# Patient Record
Sex: Female | Born: 1995 | Race: White | Hispanic: No | Marital: Married | Smoking: Never smoker
Health system: Southern US, Community
[De-identification: ages and names within clinical notes are randomized; demographics above are authoritative.]

## PROBLEM LIST (undated history)

## (undated) DIAGNOSIS — F32A Depression, unspecified: Secondary | ICD-10-CM

## (undated) DIAGNOSIS — F909 Attention-deficit hyperactivity disorder, unspecified type: Secondary | ICD-10-CM

## (undated) DIAGNOSIS — R519 Headache, unspecified: Secondary | ICD-10-CM

## (undated) DIAGNOSIS — Z87820 Personal history of traumatic brain injury: Secondary | ICD-10-CM

## (undated) DIAGNOSIS — D649 Anemia, unspecified: Secondary | ICD-10-CM

## (undated) DIAGNOSIS — J45909 Unspecified asthma, uncomplicated: Secondary | ICD-10-CM

## (undated) DIAGNOSIS — U071 COVID-19: Secondary | ICD-10-CM

## (undated) DIAGNOSIS — L2989 Other pruritus: Secondary | ICD-10-CM

## (undated) DIAGNOSIS — Z973 Presence of spectacles and contact lenses: Secondary | ICD-10-CM

## (undated) DIAGNOSIS — F329 Major depressive disorder, single episode, unspecified: Secondary | ICD-10-CM

---
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
## 2015-06-15 DIAGNOSIS — N921 Excessive and frequent menstruation with irregular cycle: Secondary | ICD-10-CM | POA: Insufficient documentation

## 2016-07-26 DIAGNOSIS — N930 Postcoital and contact bleeding: Secondary | ICD-10-CM | POA: Insufficient documentation

## 2018-02-08 ENCOUNTER — Ambulatory Visit: Payer: BLUE CROSS/BLUE SHIELD | Admitting: Neurology

## 2018-07-29 DIAGNOSIS — F4323 Adjustment disorder with mixed anxiety and depressed mood: Secondary | ICD-10-CM | POA: Insufficient documentation

## 2018-09-03 DIAGNOSIS — J4520 Mild intermittent asthma, uncomplicated: Secondary | ICD-10-CM | POA: Insufficient documentation

## 2018-09-03 DIAGNOSIS — T783XXA Angioneurotic edema, initial encounter: Secondary | ICD-10-CM | POA: Insufficient documentation

## 2019-05-03 ENCOUNTER — Other Ambulatory Visit: Payer: Self-pay

## 2019-05-03 ENCOUNTER — Emergency Department (HOSPITAL_COMMUNITY)
Admission: EM | Admit: 2019-05-03 | Discharge: 2019-05-03 | Disposition: A | Discharge status: Home / Self Care | Payer: BLUE CROSS/BLUE SHIELD | Attending: Emergency Medicine | Admitting: Emergency Medicine

## 2019-05-03 ENCOUNTER — Emergency Department
Admission: EM | Admit: 2019-05-03 | Discharge: 2019-05-03 | Discharge status: Against Medical Advice | Payer: Self-pay | Attending: Emergency Medicine | Admitting: Emergency Medicine

## 2019-05-03 ENCOUNTER — Encounter (HOSPITAL_COMMUNITY): Payer: Self-pay | Admitting: Emergency Medicine

## 2019-05-03 ENCOUNTER — Emergency Department (HOSPITAL_COMMUNITY): Payer: BLUE CROSS/BLUE SHIELD

## 2019-05-03 DIAGNOSIS — Y939 Activity, unspecified: Secondary | ICD-10-CM | POA: Insufficient documentation

## 2019-05-03 DIAGNOSIS — Y999 Unspecified external cause status: Secondary | ICD-10-CM | POA: Diagnosis not present

## 2019-05-03 DIAGNOSIS — G44319 Acute post-traumatic headache, not intractable: Secondary | ICD-10-CM | POA: Diagnosis not present

## 2019-05-03 DIAGNOSIS — S199XXA Unspecified injury of neck, initial encounter: Secondary | ICD-10-CM | POA: Diagnosis present

## 2019-05-03 DIAGNOSIS — S161XXA Strain of muscle, fascia and tendon at neck level, initial encounter: Secondary | ICD-10-CM | POA: Diagnosis not present

## 2019-05-03 DIAGNOSIS — Y92410 Unspecified street and highway as the place of occurrence of the external cause: Secondary | ICD-10-CM | POA: Diagnosis not present

## 2019-05-03 HISTORY — DX: Depression, unspecified: F32.A

## 2019-05-03 MED ORDER — METHOCARBAMOL 500 MG PO TABS
500.0000 mg | ORAL_TABLET | Freq: Two times a day (BID) | ORAL | 0 refills | Status: DC
Start: 2019-05-03 — End: 2019-08-05
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
## 2019-05-03 NOTE — ED Notes (Signed)
Patient verbalizes understanding of discharge instructions. Opportunity for questioning and answers were provided. Armband removed by staff, pt discharged from ED ambulatory.   
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
## 2019-05-05 ENCOUNTER — Telehealth: Payer: Self-pay
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
## 2019-05-05 NOTE — Telephone Encounter (Signed)
Returned pt's call.  She is having some memory difficulty, blurred vision.  She is having headaches and some sensitivity to light, particularly blue light.  She also notes some increased irritability.  She has been taking Flexeril and Excedrin.  Scheduled pt to see Dr. Denyse Amass on 05/08/19.

## 2019-05-08 ENCOUNTER — Encounter: Payer: Self-pay | Admitting: Family Medicine

## 2019-05-08 ENCOUNTER — Other Ambulatory Visit: Payer: Self-pay

## 2019-05-08 ENCOUNTER — Ambulatory Visit (INDEPENDENT_AMBULATORY_CARE_PROVIDER_SITE_OTHER): Payer: BLUE CROSS/BLUE SHIELD | Admitting: Family Medicine

## 2019-05-08 VITALS — BP 110/78 | HR 85 | Ht 61.0 in | Wt 157.0 lb

## 2019-05-08 DIAGNOSIS — R41840 Attention and concentration deficit: Secondary | ICD-10-CM | POA: Diagnosis not present

## 2019-05-08 DIAGNOSIS — S060X0A Concussion without loss of consciousness, initial encounter: Secondary | ICD-10-CM | POA: Diagnosis not present

## 2019-05-08 DIAGNOSIS — R519 Headache, unspecified: Secondary | ICD-10-CM

## 2019-05-08 DIAGNOSIS — R42 Dizziness and giddiness: Secondary | ICD-10-CM | POA: Diagnosis not present

## 2019-05-08 NOTE — Patient Instructions (Signed)
Thank you for coming in today.  Continue chronic medications.  Get plenty of sleep.  If you are doing an activity and it makes you worse stop it if you can.  Recheck in about 2 weeks.   We may consider other medicines in the future.
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
## 2019-05-22 ENCOUNTER — Telehealth: Payer: Self-pay

## 2019-05-22 ENCOUNTER — Ambulatory Visit: Payer: BLUE CROSS/BLUE SHIELD | Admitting: Family Medicine

## 2019-05-22 DIAGNOSIS — Z0289 Encounter for other administrative examinations: Secondary | ICD-10-CM
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
## 2019-05-22 NOTE — Telephone Encounter (Signed)
Called and LM for pt to return call to r/s.

## 2019-06-05 ENCOUNTER — Ambulatory Visit (INDEPENDENT_AMBULATORY_CARE_PROVIDER_SITE_OTHER): Payer: BLUE CROSS/BLUE SHIELD | Admitting: Family Medicine

## 2019-06-05 ENCOUNTER — Other Ambulatory Visit: Payer: Self-pay

## 2019-06-05 ENCOUNTER — Encounter: Payer: Self-pay | Admitting: Family Medicine

## 2019-06-05 VITALS — BP 104/78 | HR 90 | Ht 61.0 in | Wt 160.6 lb

## 2019-06-05 DIAGNOSIS — R41840 Attention and concentration deficit: Secondary | ICD-10-CM | POA: Diagnosis not present

## 2019-06-05 DIAGNOSIS — S060X0D Concussion without loss of consciousness, subsequent encounter: Secondary | ICD-10-CM

## 2019-06-05 MED ORDER — AMPHETAMINE-DEXTROAMPHET ER 20 MG PO CP24: 20.0000 mg | ORAL_CAPSULE | ORAL | 0 refills | Status: DC

## 2019-06-05 NOTE — Patient Instructions (Signed)
Thank you for coming in today. Start adderall XR 20. We can adjust the dose if needed.  Recheck in 4 weeks.  If you have to delay the follow up you will run out early. Let me know. I can also adjust the dose based on your symptoms.  I do not expect this to be a miracle cure but I do expect it to help with your symptoms.   Amphetamine; Dextroamphetamine extended-release capsules What is this medicine? AMPHETAMINE; DEXTROAMPHETAMINE (am FET a meen; dex troe am FET a meen) is used to treat attention-deficit hyperactivity disorder (ADHD). Federal law prohibits giving this medicine to any person other than the person for whom it was prescribed. Do not share this medicine with anyone else. This medicine may be used for other purposes; ask your health care provider or pharmacist if you have questions. COMMON BRAND NAME(S): Adderall XR, Mydayis What should I tell my health care provider before I take this medicine? They need to know if you have any of these conditions:  anxiety or panic attacks  circulation problems in fingers and toes  glaucoma  hardening or blockages of the arteries or heart blood vessels  heart disease or a heart defect  high blood pressure  history of a drug or alcohol abuse problem  history of stroke  kidney disease  liver disease  mental illness  seizures  suicidal thoughts, plans, or attempt; a previous suicide attempt by you or a family member  thyroid disease  Tourette's syndrome  an unusual or allergic reaction to dextroamphetamine, other amphetamines, other medicines, foods, dyes, or preservatives  pregnant or trying to get pregnant  breast-feeding How should I use this medicine? Take this medicine by mouth with a glass of water. Follow the directions on the prescription label. This medicine is taken just one time per day, usually in the morning after waking up. Take with or without food. Do not chew or crush this medicine. You may open the  capsules and sprinkle the medicine on a spoonful of applesauce. If sprinkled on applesauce, take the dose immediately and do not crush or chew. Do not take your medicine more often than directed. A special MedGuide will be given to you by the pharmacist with each prescription and refill. Be sure to read this information carefully each time. Talk to your pediatrician regarding the use of this medicine in children. While this drug may be prescribed for children as young as 6 years for selected conditions, precautions do apply. Some extended-release capsules are recommended for use only in children 75 years of age and older. Overdosage: If you think you have taken too much of this medicine contact a poison control center or emergency room at once. NOTE: This medicine is only for you. Do not share this medicine with others. What if I miss a dose? If you miss a dose, take it as soon as you can in the morning, but do not take it later in the day because it can cause trouble sleeping. If it is almost time for your next dose, take only that dose. Do not take double or extra doses. What may interact with this medicine? Do not take this medicine with any of the following medications:  MAOIs like Carbex, Eldepryl, Marplan, Nardil, and Parnate  other stimulant medicines for attention disorders This medicine may also interact with the following medications:  acetazolamide  alcohol  ammonium chloride  antacids  ascorbic acid  atomoxetine  caffeine  certain medicines for blood pressure  certain medicines for depression, anxiety, or psychotic disturbances  certain medicines for seizures like carbamazepine, phenobarbital, phenytoin  certain medicines for stomach problems like cimetidine, ranitidine, famotidine, esomeprazole, omeprazole, lansoprazole, pantoprazole  lithium  medicines for colds and breathing difficulties  medicines for diabetes  medicines or dietary supplements for weight  loss or to stay awake  methenamine  narcotic medicines for pain  quinidine  ritonavir  sodium bicarbonate  St. John's wort This list may not describe all possible interactions. Give your health care provider a list of all the medicines, herbs, non-prescription drugs, or dietary supplements you use. Also tell them if you smoke, drink alcohol, or use illegal drugs. Some items may interact with your medicine. What should I watch for while using this medicine? Visit your doctor or health care professional for regular checks on your progress. This prescription requires that you follow special procedures with your doctor and pharmacy. You will need to have a new written prescription from your doctor every time you need a refill. This medicine may affect your concentration, or hide signs of tiredness. Until you know how this medicine affects you, do not drive, ride a bicycle, use machinery, or do anything that needs mental alertness. Alcohol should be avoided with some brands of this medicine. Talk to your doctor or health care professional if you have questions. Tell your doctor or health care professional if this medicine loses its effects, or if you feel you need to take more than the prescribed amount. Do not change the dosage without talking to your doctor or health care professional. Decreased appetite is a common side effect when starting this medicine. Eating small, frequent meals or snacks can help. Talk to your doctor if you continue to have poor eating habits. Height and weight growth of a child taking this medicine will be monitored closely. Do not take this medicine close to bedtime. It may prevent you from sleeping. If you are going to need surgery, an MRI, a CT scan, or other procedure, tell your doctor that you are taking this medicine. You may need to stop taking this medicine before the procedure. Tell your doctor or healthcare professional right away if you notice unexplained wounds  on your fingers and toes while taking this medicine. You should also tell your healthcare provider if you experience numbness or pain, changes in the skin color, or sensitivity to temperature in your fingers or toes. What side effects may I notice from receiving this medicine? Side effects that you should report to your doctor or health care professional as soon as possible:  allergic reactions like skin rash, itching or hives, swelling of the face, lips, or tongue  anxious  breathing problems  changes in emotions or moods  changes in vision  chest pain or chest tightness  fast, irregular heartbeat  fingers or toes feel numb, cool, painful  hallucination, loss of contact with reality  high blood pressure  males: prolonged or painful erection  seizures  signs and symptoms of serotonin syndrome like confusion, increased sweating, fever, tremor, stiff muscles, diarrhea  signs and symptoms of a stroke like changes in vision; confusion; trouble speaking or understanding; severe headaches; sudden numbness or weakness of the face, arm or leg; trouble walking; dizziness; loss of balance or coordination  suicidal thoughts or other mood changes  uncontrollable head, mouth, neck, arm, or leg movements Side effects that usually do not require medical attention (report to your doctor or health care professional if they continue or are bothersome):  dry mouth  headache  irritability  loss of appetite  nausea  trouble sleeping  weight loss This list may not describe all possible side effects. Call your doctor for medical advice about side effects. You may report side effects to FDA at 1-800-FDA-1088. Where should I keep my medicine? Keep out of the reach of children. This medicine can be abused. Keep your medicine in a safe place to protect it from theft. Do not share this medicine with anyone. Selling or giving away this medicine is dangerous and against the law. Store at room  temperature between 15 and 30 degrees C (59 and 86 degrees F). Keep container tightly closed. Protect from light. Throw away any unused medicine after the expiration date. NOTE: This sheet is a summary. It may not cover all possible information. If you have questions about this medicine, talk to your doctor, pharmacist, or health care provider.  2020 Elsevier/Gold Standard (2016-02-13 13:37:27)
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
## 2019-07-01 ENCOUNTER — Encounter: Payer: Self-pay | Admitting: Family Medicine

## 2019-07-01 ENCOUNTER — Other Ambulatory Visit: Payer: Self-pay

## 2019-07-01 ENCOUNTER — Ambulatory Visit (INDEPENDENT_AMBULATORY_CARE_PROVIDER_SITE_OTHER): Payer: BLUE CROSS/BLUE SHIELD | Admitting: Family Medicine

## 2019-07-01 VITALS — BP 120/70 | HR 98 | Ht 61.0 in | Wt 153.8 lb

## 2019-07-01 DIAGNOSIS — R41840 Attention and concentration deficit: Secondary | ICD-10-CM | POA: Diagnosis not present

## 2019-07-01 DIAGNOSIS — S060X0D Concussion without loss of consciousness, subsequent encounter: Secondary | ICD-10-CM | POA: Diagnosis not present

## 2019-07-01 MED ORDER — AMPHETAMINE-DEXTROAMPHET ER 25 MG PO CP24
25.0000 mg | ORAL_CAPSULE | ORAL | 0 refills | Status: DC
Start: 2019-07-01 — End: 2019-08-05
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
## 2019-07-01 NOTE — Patient Instructions (Signed)
Thank you for coming in today. Will increase adderall to 25.  Let me know how it goes.  Recheck in 4 weeks or so.

## 2019-07-11 ENCOUNTER — Ambulatory Visit
Admission: EM | Admit: 2019-07-11 | Discharge: 2019-07-11 | Disposition: A | Discharge status: Home / Self Care | Payer: BLUE CROSS/BLUE SHIELD | Attending: Family Medicine | Admitting: Family Medicine

## 2019-07-11 DIAGNOSIS — Z760 Encounter for issue of repeat prescription: Secondary | ICD-10-CM

## 2019-07-11 HISTORY — DX: Attention-deficit hyperactivity disorder, unspecified type: F90.9

## 2019-07-11 MED ORDER — VENLAFAXINE HCL ER 150 MG PO TB24: 150.0000 mg | ORAL_TABLET | Freq: Every day | ORAL | 2 refills | Status: DC

## 2019-07-11 NOTE — Discharge Instructions (Signed)
I have refilled your venlafaxine  Get back in with psych to have this refilled in the future  Follow-up with this office or with psych as needed.

## 2019-07-11 NOTE — ED Provider Notes (Signed)
[facility]IS[facility]    CSN: [id number] Arrival date & time: 07/11/19  8242      History   Chief Complaint Chief Complaint  Patient presents with  . Medication Reaction    HPI April Conner is a 24 y.o. female.   Patient reports that she is here for medication refill. Reports that she is out of her venlafaxine. Reports that she cannot get in to see psych for the next few weeks. Reports that she is experiencing shakiness, dizziness, irritability. Reports that she has not experienced this in the past. Reports that she is not run out of her medications before.  Denies headache, cough, shortness of breath, nausea, vomiting, diarrhea, rash, fever, other symptoms.  ROS per HPI  The history is provided by the patient.    Past Medical History:  Diagnosis Date  . ADHD   . Depression     Patient Active Problem List   Diagnosis Date Noted  . Difficulty concentrating 05/08/2019  . Concussion without loss of consciousness 05/08/2019  . Angioedema 09/03/2018  . Mild intermittent asthma without complication 09/03/2018  . Adjustment disorder with mixed anxiety and depressed mood 07/29/2018  . Migraine without aura and without status migrainosus, not intractable 02/12/2013  . Dizziness 01/01/2013  . Headache 01/01/2013    Past Surgical History:  Procedure Laterality Date  . WISDOM TOOTH EXTRACTION  2012    OB History   No obstetric history on file.      Home Medications    Prior to Admission medications   Medication Sig Start Date End Date Taking? Authorizing Provider  amphetamine-dextroamphetamine (ADDERALL XR) 25 MG 24 hr capsule Take 1 capsule by mouth every morning. 07/01/19   Rodolph Bong, MD  cetirizine (ZYRTEC) 10 MG tablet Take 10 mg by mouth daily as needed for allergies.    [provider]  magnesium oxide (MAG-OX) 400 MG tablet Take 400 mg by mouth daily.    [provider]  Melatonin 3 MG TABS Take by mouth.    [provider]   methocarbamol (ROBAXIN) 500 MG tablet Take 1 tablet (500 mg total) by mouth 2 (two) times daily. Patient not taking: Reported on 06/05/2019 05/03/19   Henderly, Britni A, PA-C  Norethindrone Acet-Ethinyl Est (JUNEL 1/20 PO) Take by mouth.    [provider]  riboflavin (VITAMIN B-2) 100 MG TABS tablet Take 100 mg by mouth daily.    [provider]  traZODone (DESYREL) 50 MG tablet TAKE ONE TABLET NIGHTLY. CAN INCREASE TO 2 TABLETS AFTER 1 WEEK IF NEEDED 04/30/19   [provider]  Venlafaxine HCl 150 MG TB24 Take 1 tablet (150 mg total) by mouth daily. 07/11/19 10/09/19  Moshe Cipro, NP    Family History Family History  Problem Relation Age of Onset  . Lung cancer Maternal Grandmother   . Heart Problems Maternal Grandfather     Social History Social History   Tobacco Use  . Smoking status: Never Smoker  . Smokeless tobacco: Never Used  Vaping Use  . Vaping Use: Never used  Substance Use Topics  . Alcohol use: No  . Drug use: No     Allergies   Patient has no known allergies.   Review of Systems Review of Systems   Physical Exam Triage Vital Signs ED Triage Vitals  Enc Vitals Group     BP 07/11/19 1002 116/78     Pulse Rate 07/11/19 1002 88     Resp 07/11/19 1002 16  Temp 07/11/19 1002 98.7 F (37.1 C)     Temp src --      SpO2 07/11/19 1002 97 %     Weight --      Height --      Head Circumference --      Peak Flow --      Pain Score 07/11/19 0959 0     Pain Loc --      Pain Edu? --      Excl. in GC? --    No data found.  Updated Vital Signs BP 116/78   Pulse 88   Temp 98.7 F (37.1 C)   Resp 16   LMP 06/30/2019 (Within Days)   SpO2 97%   Visual Acuity Right Eye Distance:   Left Eye Distance:   Bilateral Distance:    Right Eye Near:   Left Eye Near:    Bilateral Near:     Physical Exam Vitals and nursing note reviewed.  Constitutional:      General: She is not in acute distress.    Appearance: Normal  appearance. She is well-developed and normal weight. She is not ill-appearing.  HENT:     Head: Normocephalic and atraumatic.  Eyes:     Conjunctiva/sclera: Conjunctivae normal.  Cardiovascular:     Rate and Rhythm: Normal rate and regular rhythm.     Pulses: Normal pulses.     Heart sounds: Normal heart sounds. No murmur heard.   Pulmonary:     Effort: Pulmonary effort is normal. No respiratory distress.     Breath sounds: Normal breath sounds. No stridor. No wheezing, rhonchi or rales.  Chest:     Chest wall: No tenderness.  Abdominal:     Palpations: Abdomen is soft.     Tenderness: There is no abdominal tenderness.  Musculoskeletal:        General: Normal range of motion.     Cervical back: Normal range of motion and neck supple.  Skin:    General: Skin is warm and dry.     Capillary Refill: Capillary refill takes less than 2 seconds.  Neurological:     General: No focal deficit present.     Mental Status: She is alert and oriented to person, place, and time.  Psychiatric:        Mood and Affect: Mood normal.        Behavior: Behavior normal.        Thought Content: Thought content normal.      UC Treatments / Results  Labs (all labs ordered are listed, but only abnormal results are displayed) Labs Reviewed - No data to display  EKG   Radiology No results found.  Procedures Procedures (including critical care time)  Medications Ordered in UC Medications - No data to display  Initial Impression / Assessment and Plan / UC Course  I have reviewed the triage vital signs and the nursing notes.  Pertinent labs & imaging results that were available during my care of the patient were reviewed by me and considered in my medical decision making (see chart for details).     Medication refill  Presents today for refill of venlafaxine Cannot get in to see psych for 2 weeks Experiencing symptoms of dizziness, shakiness, irritability Will refill medication  today Instructed patient to get in with psych as soon as she can Follow-up with this office or PCP as needed Follow-up the ER for trouble swallowing, trouble breathing, other concerning symptoms  Patient verbalized understanding  is in agreement treatment plan Final Clinical Impressions(s) / UC Diagnoses   Final diagnoses:  Medication refill     Discharge Instructions     I have refilled your venlafaxine  Get back in with psych to have this refilled in the future  Follow-up with this office or with psych as needed.    ED Prescriptions    Medication Sig Dispense Auth. Provider   Venlafaxine HCl 150 MG TB24 Take 1 tablet (150 mg total) by mouth daily. 30 tablet Moshe Cipro, NP     PDMP not reviewed this encounter.   Moshe Cipro, NP 07/11/19 1049

## 2019-07-11 NOTE — ED Triage Notes (Addendum)
Patient reports she has been out of venlafaxine hcl 150mg  tb24 for two days. States she now experiencing lightheadness, shakiness, and irritability. Reports she called psychiatry and no one can see her for weeks.

## 2019-08-05 ENCOUNTER — Other Ambulatory Visit: Payer: Self-pay

## 2019-08-05 ENCOUNTER — Encounter: Payer: Self-pay | Admitting: Family Medicine

## 2019-08-05 ENCOUNTER — Ambulatory Visit (INDEPENDENT_AMBULATORY_CARE_PROVIDER_SITE_OTHER): Payer: BLUE CROSS/BLUE SHIELD | Admitting: Family Medicine

## 2019-08-05 VITALS — BP 92/64 | HR 91 | Ht 61.0 in | Wt 147.8 lb

## 2019-08-05 DIAGNOSIS — S060X0D Concussion without loss of consciousness, subsequent encounter: Secondary | ICD-10-CM | POA: Diagnosis not present

## 2019-08-05 DIAGNOSIS — F902 Attention-deficit hyperactivity disorder, combined type: Secondary | ICD-10-CM | POA: Diagnosis not present

## 2019-08-05 MED ORDER — AMPHETAMINE-DEXTROAMPHET ER 25 MG PO CP24
25.0000 mg | ORAL_CAPSULE | ORAL | 0 refills | Status: AC
Start: 2019-08-05 — End: ?

## 2019-08-05 NOTE — Patient Instructions (Signed)
Thank you for coming in today. Continue adderall. Establish care with a mental health provider that can prescribe medicine for ADHD in the future.  Let me know if you need a refill or have a problem.  [facility] here in [place] are a good option there are similar options in [place] are and in [facility] with me as needed.
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
## 2019-08-06 DIAGNOSIS — R102 Pelvic and perineal pain unspecified side: Secondary | ICD-10-CM | POA: Insufficient documentation

## 2019-09-02 ENCOUNTER — Ambulatory Visit
Admission: EM | Admit: 2019-09-02 | Discharge: 2019-09-02 | Disposition: A | Discharge status: Home / Self Care | Payer: BLUE CROSS/BLUE SHIELD | Attending: Emergency Medicine | Admitting: Emergency Medicine

## 2019-09-02 ENCOUNTER — Other Ambulatory Visit: Payer: Self-pay

## 2019-09-02 ENCOUNTER — Encounter: Payer: Self-pay | Admitting: Family Medicine

## 2019-09-02 DIAGNOSIS — R110 Nausea: Secondary | ICD-10-CM

## 2019-09-02 DIAGNOSIS — B349 Viral infection, unspecified: Secondary | ICD-10-CM | POA: Diagnosis not present

## 2019-09-02 DIAGNOSIS — R197 Diarrhea, unspecified: Secondary | ICD-10-CM

## 2019-09-02 MED ORDER — ONDANSETRON HCL 4 MG PO TABS
4.0000 mg | ORAL_TABLET | Freq: Four times a day (QID) | ORAL | 0 refills | Status: DC
Start: 2019-09-02 — End: 2022-11-16

## 2019-09-02 NOTE — ED Provider Notes (Signed)
Renaldo Fiddler    CSN: [id number] Arrival date & time: 09/02/19  1516      History   Chief Complaint Chief Complaint  Patient presents with  . Diarrhea  . Fatigue    HPI April Conner is a 24 y.o. female.   Patient presents with nausea, fatigue, postnasal drip, diarrhea x5 days.  No episodes of diarrhea today.  No emesis.  Patient denies fever, chills, shortness of breath, abdominal pain, or other symptoms.  No treatments attempted at home.  Patient had a negative PCR COVID this week.   The history is provided by the patient.    Past Medical History:  Diagnosis Date  . ADHD   . Depression     Patient Active Problem List   Diagnosis Date Noted  . Difficulty concentrating 05/08/2019  . Concussion without loss of consciousness 05/08/2019  . Angioedema 09/03/2018  . Mild intermittent asthma without complication 09/03/2018  . Adjustment disorder with mixed anxiety and depressed mood 07/29/2018  . Migraine without aura and without status migrainosus, not intractable 02/12/2013  . Dizziness 01/01/2013  . Headache 01/01/2013    Past Surgical History:  Procedure Laterality Date  . WISDOM TOOTH EXTRACTION  2012    OB History   No obstetric history on file.      Home Medications    Prior to Admission medications   Medication Sig Start Date End Date Taking? Authorizing Provider  amphetamine-dextroamphetamine (ADDERALL XR) 25 MG 24 hr capsule Take 1 capsule by mouth every morning. 08/05/19   Rodolph Bong, MD  cetirizine (ZYRTEC) 10 MG tablet Take 10 mg by mouth daily as needed for allergies.    [provider]  magnesium oxide (MAG-OX) 400 MG tablet Take 400 mg by mouth daily.    [provider]  Melatonin 3 MG TABS Take by mouth.    [provider]  Norethindrone Acet-Ethinyl Est (JUNEL 1/20 PO) Take by mouth.    [provider]  ondansetron (ZOFRAN) 4 MG tablet Take 1 tablet (4 mg total) by mouth every 6 (six) hours.  09/02/19   Mickie Bail, NP  riboflavin (VITAMIN B-2) 100 MG TABS tablet Take 100 mg by mouth daily.    [provider]  traZODone (DESYREL) 50 MG tablet TAKE ONE TABLET NIGHTLY. CAN INCREASE TO 2 TABLETS AFTER 1 WEEK IF NEEDED 04/30/19   [provider]  Venlafaxine HCl 150 MG TB24 Take 1 tablet (150 mg total) by mouth daily. 07/11/19 10/09/19  Moshe Cipro, NP    Family History Family History  Problem Relation Age of Onset  . Lung cancer Maternal Grandmother   . Heart Problems Maternal Grandfather     Social History Social History   Tobacco Use  . Smoking status: Never Smoker  . Smokeless tobacco: Never Used  Vaping Use  . Vaping Use: Never used  Substance Use Topics  . Alcohol use: No  . Drug use: No     Allergies   Patient has no known allergies.   Review of Systems Review of Systems  Constitutional: Positive for fatigue. Negative for chills and fever.  HENT: Positive for postnasal drip. Negative for ear pain and sore throat.   Eyes: Negative for pain and visual disturbance.  Respiratory: Negative for cough and shortness of breath.   Cardiovascular: Negative for chest pain and palpitations.  Gastrointestinal: Positive for diarrhea and nausea. Negative for abdominal pain and vomiting.  Genitourinary: Negative for dysuria and hematuria.  Musculoskeletal: Negative for  arthralgias and back pain.  Skin: Negative for color change and rash.  Neurological: Negative for seizures and syncope.  All other systems reviewed and are negative.    Physical Exam Triage Vital Signs ED Triage Vitals  Enc Vitals Group     BP      Pulse      Resp      Temp      Temp src      SpO2      Weight      Height      Head Circumference      Peak Flow      Pain Score      Pain Loc      Pain Edu?      Excl. in GC?    No data found.  Updated Vital Signs BP 123/82   Pulse 88   Temp 98.2 F (36.8 C)   Resp 14   LMP 08/26/2019 (Exact Date)   SpO2 97%    Visual Acuity Right Eye Distance:   Left Eye Distance:   Bilateral Distance:    Right Eye Near:   Left Eye Near:    Bilateral Near:     Physical Exam Vitals and nursing note reviewed.  Constitutional:      General: She is not in acute distress.    Appearance: She is well-developed. She is not ill-appearing.  HENT:     Head: Normocephalic and atraumatic.     Right Ear: Tympanic membrane normal.     Left Ear: Tympanic membrane normal.     Nose: Nose normal.     Mouth/Throat:     Mouth: Mucous membranes are moist.     Pharynx: Oropharynx is clear.  Eyes:     Conjunctiva/sclera: Conjunctivae normal.  Cardiovascular:     Rate and Rhythm: Normal rate and regular rhythm.     Heart sounds: No murmur heard.   Pulmonary:     Effort: Pulmonary effort is normal. No respiratory distress.     Breath sounds: Normal breath sounds.  Abdominal:     General: Bowel sounds are normal.     Palpations: Abdomen is soft.     Tenderness: There is no abdominal tenderness. There is no guarding or rebound.  Musculoskeletal:     Cervical back: Neck supple.  Skin:    General: Skin is warm and dry.     Findings: No rash.  Neurological:     General: No focal deficit present.     Mental Status: She is alert and oriented to person, place, and time.     Gait: Gait normal.  Psychiatric:        Mood and Affect: Mood normal.        Behavior: Behavior normal.      UC Treatments / Results  Labs (all labs ordered are listed, but only abnormal results are displayed) Labs Reviewed - No data to display  EKG   Radiology No results found.  Procedures Procedures (including critical care time)  Medications Ordered in UC Medications - No data to display  Initial Impression / Assessment and Plan / UC Course  I have reviewed the triage vital signs and the nursing notes.  Pertinent labs & imaging results that were available during my care of the patient were reviewed by me and considered in my  medical decision making (see chart for details).   Diarrhea, viral illness, nausea.  Treating nausea with Zofran.  Discussed clear liquid diet and diarrhea diet.  Instructed patient to follow-up with her PCP if she is not improving.  Patient agrees to plan of care.   Final Clinical Impressions(s) / UC Diagnoses   Final diagnoses:  Diarrhea, unspecified type  Viral illness  Nausea without vomiting     Discharge Instructions     Take the antinausea medication as directed.    Keep yourself hydrated with clear liquids, such as water, Gatorade, Pedialyte, Sprite, or ginger ale.  Follow the attached diarrhea diet.    Follow up with your primary care provider if your symptoms are not improving.           ED Prescriptions    Medication Sig Dispense Auth. Provider   ondansetron (ZOFRAN) 4 MG tablet Take 1 tablet (4 mg total) by mouth every 6 (six) hours. 12 tablet Mickie Bail, NP     PDMP not reviewed this encounter.   Mickie Bail, NP 09/02/19 [phone]

## 2019-09-02 NOTE — Discharge Instructions (Signed)
Take the antinausea medication as directed.    Keep yourself hydrated with clear liquids, such as water, Gatorade, Pedialyte, Sprite, or ginger ale.  Follow the attached diarrhea diet.    Follow up with your primary care provider if your symptoms are not improving.

## 2019-09-02 NOTE — ED Triage Notes (Signed)
Patient reports nasal drainage, fatigue, diarrhea, and nausea x5 days. Reports she did a rapid covid ([facility]) and a PCR covid ([facility]), both of which came back negative. Reports nasal drainage has gotten better. Patient here today for fatigue and diarrhea symptoms.

## 2020-01-07 DIAGNOSIS — J020 Streptococcal pharyngitis: Secondary | ICD-10-CM | POA: Insufficient documentation

## 2020-02-10 DIAGNOSIS — Z8616 Personal history of COVID-19: Secondary | ICD-10-CM | POA: Insufficient documentation

## 2020-02-10 DIAGNOSIS — Z87820 Personal history of traumatic brain injury: Secondary | ICD-10-CM | POA: Insufficient documentation

## 2020-05-26 ENCOUNTER — Other Ambulatory Visit: Payer: Self-pay | Admitting: Internal Medicine

## 2020-05-26 DIAGNOSIS — R7989 Other specified abnormal findings of blood chemistry: Secondary | ICD-10-CM

## 2020-06-12 ENCOUNTER — Other Ambulatory Visit: Payer: BLUE CROSS/BLUE SHIELD

## 2022-01-02 DIAGNOSIS — N750 Cyst of Bartholin's gland: Secondary | ICD-10-CM

## 2022-01-02 DIAGNOSIS — O133 Gestational [pregnancy-induced] hypertension without significant proteinuria, third trimester: Secondary | ICD-10-CM

## 2022-01-02 DIAGNOSIS — R002 Palpitations: Secondary | ICD-10-CM

## 2022-01-02 HISTORY — DX: Cyst of Bartholin's gland: N75.0

## 2022-01-02 HISTORY — DX: Palpitations: R00.2

## 2022-01-02 HISTORY — DX: Gestational (pregnancy-induced) hypertension without significant proteinuria, third trimester: O13.3

## 2022-01-02 NOTE — L&D Delivery Note (Signed)
Delivery Note Patient progressed along an normal labor curve to 10/100/+2.  She pushed well for 20 minutes. At 6:04 AM a viable female was delivered via Vaginal, Spontaneous (Presentation: Right Occiput Anterior).  APGAR: 9, 9; weight 6 lb 4.9 oz (2860 gm) .   Placenta status: Spontaneous, Intact.  Cord: 3 vessels with the following complications: None.  Cord pH: n/a  Patient requested to delay cord clamping until the cord stop pulsating, which occurred around 5 minutes of life.  Shortly after delivery of the placenta, uterine atony was noted with an increase in bleeding.  Uterus firmed quickly with bimanual massage.  1 gm of TXA was administered.  Tone remained firm with normal bleeding after TXA / pitocin.    A rectal exam confirmed the perineal laceration was a second degree and did not extend into the rectum.  DRE following repair noted good rectal tone.  Patient also had a 2 cm abscess on the inferior right labia that she noted on admission and requested evaluation and drainage.  Verbal consent was obtained. The skin was cleansed with betadine.  A stab incision made with an 11 blade.  Purulent drainage was expressed.  Hemostasis was obtained with pressure  Anesthesia: Epidural and 1% lidocaine Episiotomy: None Lacerations: 2nd degree Suture Repair: 2.0 vicryl rapide Est. Blood Loss (mL): 450    Mom to postpartum.  Baby to Couplet care / Skin to Skin.  [facility] GEFFEL Cherine Drumgoole 11/14/2022, 6:53 AM

## 2022-02-01 DIAGNOSIS — J45909 Unspecified asthma, uncomplicated: Secondary | ICD-10-CM | POA: Diagnosis not present

## 2022-02-01 DIAGNOSIS — F32A Depression, unspecified: Secondary | ICD-10-CM | POA: Diagnosis not present

## 2022-02-01 DIAGNOSIS — R0602 Shortness of breath: Secondary | ICD-10-CM | POA: Diagnosis not present

## 2022-02-01 DIAGNOSIS — F419 Anxiety disorder, unspecified: Secondary | ICD-10-CM | POA: Diagnosis not present

## 2022-02-01 DIAGNOSIS — R202 Paresthesia of skin: Secondary | ICD-10-CM | POA: Diagnosis not present

## 2022-02-01 DIAGNOSIS — Z79899 Other long term (current) drug therapy: Secondary | ICD-10-CM | POA: Diagnosis not present

## 2022-02-01 DIAGNOSIS — R002 Palpitations: Secondary | ICD-10-CM | POA: Diagnosis not present

## 2022-02-01 DIAGNOSIS — F909 Attention-deficit hyperactivity disorder, unspecified type: Secondary | ICD-10-CM | POA: Diagnosis not present

## 2022-02-01 DIAGNOSIS — R42 Dizziness and giddiness: Secondary | ICD-10-CM | POA: Diagnosis not present

## 2022-02-01 DIAGNOSIS — R519 Headache, unspecified: Secondary | ICD-10-CM | POA: Diagnosis not present
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
## 2022-05-16 LAB — OB RESULTS CONSOLE ABO/RH: RH Type: POSITIVE

## 2022-05-16 LAB — OB RESULTS CONSOLE ANTIBODY SCREEN: Antibody Screen: NEGATIVE

## 2022-05-16 LAB — OB RESULTS CONSOLE RPR: RPR: NONREACTIVE

## 2022-05-16 LAB — OB RESULTS CONSOLE HEPATITIS B SURFACE ANTIGEN: Hepatitis B Surface Ag: NEGATIVE

## 2022-05-16 LAB — OB RESULTS CONSOLE GC/CHLAMYDIA
Chlamydia: NEGATIVE
Neisseria Gonorrhea: NEGATIVE

## 2022-05-16 LAB — OB RESULTS CONSOLE HIV ANTIBODY (ROUTINE TESTING): HIV: NONREACTIVE

## 2022-05-16 LAB — HEPATITIS C ANTIBODY: HCV Ab: NEGATIVE

## 2022-05-16 LAB — OB RESULTS CONSOLE RUBELLA ANTIBODY, IGM: Rubella: IMMUNE

## 2022-09-29 ENCOUNTER — Encounter (HOSPITAL_COMMUNITY): Payer: Self-pay | Admitting: *Deleted

## 2022-09-29 ENCOUNTER — Inpatient Hospital Stay (HOSPITAL_COMMUNITY)
Admission: AD | Admit: 2022-09-29 | Discharge: 2022-09-29 | Disposition: A | Discharge status: Home / Self Care | Payer: BC Managed Care – PPO | Attending: Obstetrics and Gynecology | Admitting: Obstetrics and Gynecology

## 2022-09-29 DIAGNOSIS — O479 False labor, unspecified: Secondary | ICD-10-CM

## 2022-09-29 DIAGNOSIS — Z3A30 30 weeks gestation of pregnancy: Secondary | ICD-10-CM | POA: Diagnosis not present

## 2022-09-29 DIAGNOSIS — O4703 False labor before 37 completed weeks of gestation, third trimester: Secondary | ICD-10-CM | POA: Diagnosis not present

## 2022-09-29 DIAGNOSIS — Z0371 Encounter for suspected problem with amniotic cavity and membrane ruled out: Secondary | ICD-10-CM

## 2022-09-29 LAB — RUPTURE OF MEMBRANE (ROM)PLUS: Rom Plus: NEGATIVE

## 2022-09-29 LAB — POCT FERN TEST: POCT Fern Test: NEGATIVE

## 2022-09-29 NOTE — MAU Note (Signed)
Pt says at 5pm- while walking- she felt a small gush of fluid - in underwear Has felt small amt since 5 pm  Since then - has had cramps in vag . Has felt lower abd burning. [facility]- Dr on 09-06-2022- all ok Last sex- not this mth

## 2022-09-29 NOTE — MAU Provider Note (Signed)
Chief Complaint:  Rupture of Membranes   Event Date/Time   First Provider Initiated Contact with Patient 09/29/22 2129      HPI: April Conner is a 27 y.o. G1P0 at [redacted]w[redacted]d who presents to maternity admissions reporting a gush of clear odorless fluid at 5 pm while walking. It was enough to make a wet spot in her underwear and shorts, but not enough to soak a pad.  There has been a smaller amount of fluid since that first episode.  She reports onset of cramping in the vaginal area and intermittent burning sensations in the low abdomen after the fluid gush at 5 pm but these have resolved.   She reports good fetal movement.    Past Medical History: Past Medical History:  Diagnosis Date   ADHD    Depression     Past obstetric history: OB History  Gravida Para Term Preterm AB Living  1            SAB IAB Ectopic Multiple Live Births               # Outcome Date GA Lbr Len/2nd Weight Sex Type Anes PTL Lv  1 Current             Past Surgical History: Past Surgical History:  Procedure Laterality Date   WISDOM TOOTH EXTRACTION  2012    Family History: Family History  Problem Relation Age of Onset   Lung cancer Maternal Grandmother    Heart Problems Maternal Grandfather     Social History: Social History   Tobacco Use   Smoking status: Never   Smokeless tobacco: Never  Vaping Use   Vaping status: Never Used  Substance Use Topics   Alcohol use: No   Drug use: No    Allergies: No Known Allergies  Meds:  Medications Prior to Admission  Medication Sig Dispense Refill Last Dose   ferrous sulfate 325 (65 FE) MG tablet Take 325 mg by mouth daily with breakfast.   Past Week   magnesium oxide (MAG-OX) 400 MG tablet Take 400 mg by mouth daily.   09/28/2022   Prenatal Vit-Fe Fumarate-FA (MULTIVITAMIN-PRENATAL) 27-0.8 MG TABS tablet Take 1 tablet by mouth daily at 12 noon.   Past Week   amphetamine-dextroamphetamine (ADDERALL XR) 25 MG 24 hr capsule Take 1 capsule by mouth  every morning. 90 capsule 0    cetirizine (ZYRTEC) 10 MG tablet Take 10 mg by mouth daily as needed for allergies.      Melatonin 3 MG TABS Take by mouth.      Norethindrone Acet-Ethinyl Est (JUNEL 1/20 PO) Take by mouth.      ondansetron (ZOFRAN) 4 MG tablet Take 1 tablet (4 mg total) by mouth every 6 (six) hours. 12 tablet 0    riboflavin (VITAMIN B-2) 100 MG TABS tablet Take 100 mg by mouth daily.      traZODone (DESYREL) 50 MG tablet TAKE ONE TABLET NIGHTLY. CAN INCREASE TO 2 TABLETS AFTER 1 WEEK IF NEEDED      Venlafaxine HCl 150 MG TB24 Take 1 tablet (150 mg total) by mouth daily. 30 tablet 2     ROS:  Review of Systems  Constitutional:  Negative for chills, fatigue and fever.  Eyes:  Negative for visual disturbance.  Respiratory:  Negative for shortness of breath.   Cardiovascular:  Negative for chest pain.  Gastrointestinal:  Positive for abdominal pain. Negative for nausea and vomiting.  Genitourinary:  Positive for vaginal discharge and  vaginal pain. Negative for difficulty urinating, dysuria, flank pain, pelvic pain and vaginal bleeding.  Neurological:  Negative for dizziness and headaches.  Psychiatric/Behavioral: Negative.       I have reviewed patient's Past Medical Hx, Surgical Hx, Family Hx, Social Hx, medications and allergies.   Physical Exam  Patient Vitals for the past 24 hrs:  BP Temp Temp src Pulse Resp SpO2 Height Weight  09/29/22 2049 135/85 -- -- (!) 113 -- 99 % -- --  09/29/22 2028 128/79 97.9 F (36.6 C) Oral (!) 102 14 -- 5\' 1"  (1.549 m) 83.5 kg   Constitutional: Well-developed, well-nourished female in no acute distress.  Cardiovascular: normal rate Respiratory: normal effort GI: Abd soft, non-tender, gravid appropriate for gestational age.  MS: Extremities nontender, no edema, normal ROM Neurologic: Alert and oriented x 4.  GU: Neg CVAT.  PELVIC EXAM:   Dilation: Closed Effacement (%): Thick Cervical Position: Posterior Exam by:: Sharen Counter, CNM  FHT:  Baseline 150 , moderate variability, accelerations present, no decelerations Contractions: none on toco or to palpatoin   Labs: Results for orders placed or performed during the hospital encounter of 09/29/22 (from the past 24 hour(s))  Rupture of Membrane (ROM) Plus     Status: None   Collection Time: 09/29/22 10:12 PM  Result Value Ref Range   Rom Plus NEGATIVE   Fern Test     Status: None   Collection Time: 09/29/22 10:28 PM  Result Value Ref Range   POCT Fern Test Negative = intact amniotic membranes       Imaging:  No results found.  MAU Course/MDM: Orders Placed This Encounter  Procedures   Rupture of Membrane (ROM) Plus   Fern Test   Discharge patient    No orders of the defined types were placed in this encounter.    NST reviewed and reactive No evidence of PTL or PPROM D/C home with PTL precautions Keep scheduled appt in the office on Tuesday   Assessment: 1. Encounter for suspected premature rupture of amniotic membranes, with rupture of membranes not found   2. [redacted] weeks gestation of pregnancy   3. Braxton Hicks contractions     Plan: Discharge home Labor precautions and fetal kick counts  Follow-up Information     Ob/Gyn, [facility] Follow up.   Why: As scheduled Contact information: [address] [address] [place] [address] [phone]         [facility] Follow up.   Specialty: Obstetrics and Gynecology Why: As needed for emergencies Contact information: [address] [place] [address] [phone]               Allergies as of 09/29/2022   No Known Allergies      Medication List     STOP taking these medications    amphetamine-dextroamphetamine 25 MG 24 hr capsule Commonly known as: Adderall XR   JUNEL 1/20 PO   traZODone 50 MG tablet Commonly known as: DESYREL   Venlafaxine HCl 150 MG Tb24       TAKE these medications     cetirizine 10 MG tablet Commonly known as: ZYRTEC Take 10 mg by mouth daily as needed for allergies.   ferrous sulfate 325 (65 FE) MG tablet Take 325 mg by mouth daily with breakfast.   magnesium oxide 400 MG tablet Commonly known as: MAG-OX Take 400 mg by mouth daily.   melatonin 3 MG Tabs tablet Take by mouth.  multivitamin-prenatal 27-0.8 MG Tabs tablet Take 1 tablet by mouth daily at 12 noon.   ondansetron 4 MG tablet Commonly known as: ZOFRAN Take 1 tablet (4 mg total) by mouth every 6 (six) hours.   riboflavin 100 MG Tabs tablet Commonly known as: VITAMIN B-2 Take 100 mg by mouth daily.        Sharen Counter Certified Nurse-Midwife 09/29/2022 11:34 PM

## 2022-09-29 NOTE — Discharge Instructions (Signed)
Reasons to return to MAU at [facility]:  Since you are preterm, return to MAU if:  1.  Contractions are 10 minutes apart or less and they becoming more uncomfortable or painful over time 2.  You have a large gush of fluid, or a trickle of fluid that will not stop and you have to wear a pad 3.  You have bleeding that is bright red, heavier than spotting--like menstrual bleeding (spotting can be normal in early labor or after a check of your cervix) 4.  You do not feel the baby moving like he/she normally does  

## 2022-10-19 ENCOUNTER — Other Ambulatory Visit: Payer: Self-pay

## 2022-10-19 ENCOUNTER — Encounter: Payer: Self-pay | Admitting: Obstetrics and Gynecology

## 2022-10-19 ENCOUNTER — Observation Stay
Admission: EM | Admit: 2022-10-19 | Discharge: 2022-10-19 | Disposition: A | Discharge status: Home / Self Care | Payer: BC Managed Care – PPO | Attending: Certified Nurse Midwife | Admitting: Certified Nurse Midwife

## 2022-10-19 DIAGNOSIS — O99891 Other specified diseases and conditions complicating pregnancy: Secondary | ICD-10-CM | POA: Diagnosis not present

## 2022-10-19 DIAGNOSIS — O0993 Supervision of high risk pregnancy, unspecified, third trimester: Secondary | ICD-10-CM | POA: Insufficient documentation

## 2022-10-19 DIAGNOSIS — O9A213 Injury, poisoning and certain other consequences of external causes complicating pregnancy, third trimester: Secondary | ICD-10-CM | POA: Diagnosis not present

## 2022-10-19 DIAGNOSIS — Z3A33 33 weeks gestation of pregnancy: Secondary | ICD-10-CM | POA: Diagnosis not present

## 2022-10-19 DIAGNOSIS — Z79899 Other long term (current) drug therapy: Secondary | ICD-10-CM | POA: Insufficient documentation

## 2022-10-19 DIAGNOSIS — T40411A Poisoning by fentanyl or fentanyl analogs, accidental (unintentional), initial encounter: Secondary | ICD-10-CM | POA: Insufficient documentation

## 2022-10-19 NOTE — OB Triage Note (Signed)
Pt G1P0 at [redacted]w[redacted]d presents to triage to be evaluated after possible exposure to fentanyl at work. 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 Pt reports the drug was in rock/gravel form. She reports she had on sandals and is unsure if she was exposed to the drug in anyway. She reports +FM and denies LOF, bleeding, or ctx. She denies any pain/headache at this time. VSS. She reports she initially felt lightheaded with the adrenaline but feels normal now. Pt receives care at [facility] in [place]. CNM made aware of pt arrival.

## 2022-10-19 NOTE — OB Triage Provider Note (Signed)
      L&D OB Triage Note  SUBJECTIVE April Conner is a 27 y.o. G1P0 female at [redacted]w[redacted]d, EDD Estimated Date of Delivery: 12/03/22 who presented to triage with complaints of  possible exposure to Fentanyl at work. She denies loss of fluid, vaginal bleeding, contractions , and is  feeling good fetal movement.   OB History  Gravida Para Term Preterm AB Living  1 0 0 0 0 0  SAB IAB Ectopic Multiple Live Births  0 0 0 0 0    # Outcome Date GA Lbr Len/2nd Weight Sex Type Anes PTL Lv  1 Current             Medications Prior to Admission  Medication Sig Dispense Refill Last Dose   ferrous sulfate 325 (65 FE) MG tablet Take 325 mg by mouth daily with breakfast.   10/18/2022   magnesium oxide (MAG-OX) 400 MG tablet Take 400 mg by mouth daily.   10/18/2022   Prenatal Vit-Fe Fumarate-FA (MULTIVITAMIN-PRENATAL) 27-0.8 MG TABS tablet Take 1 tablet by mouth daily at 12 noon.   10/18/2022   riboflavin (VITAMIN B-2) 100 MG TABS tablet Take 100 mg by mouth daily.   10/18/2022   cetirizine (ZYRTEC) 10 MG tablet Take 10 mg by mouth daily as needed for allergies. (Patient not taking: Reported on 10/19/2022)   Not Taking   Melatonin 3 MG TABS Take by mouth.      ondansetron (ZOFRAN) 4 MG tablet Take 1 tablet (4 mg total) by mouth every 6 (six) hours. (Patient not taking: Reported on 10/19/2022) 12 tablet 0 Not Taking     OBJECTIVE  Nursing Evaluation:   BP 131/81 (BP Location: Right Arm)   Pulse 86   Temp 98.2 F (36.8 C) (Oral)   Resp 16   Ht 5\' 1"  (1.549 m)   Wt 83.5 kg   BMI 34.78 kg/m    Findings:   reassuring fetal NST      NST was performed and has been reviewed by me.  NST INTERPRETATION: Category I  Mode: External Baseline Rate (A): 145 bpm Variability: Moderate Accelerations: 15 x 15 Decelerations: None     Contraction Frequency (min): rare  ASSESSMENT Impression:  1.  Pregnancy:  G1P0 at [redacted]w[redacted]d , EDD Estimated Date of Delivery: 12/03/22 2.  Reassuring fetal and maternal  status   PLAN 1. Current condition and above findings reviewed.  Reassuring fetal and maternal condition. 2. Discharge home with standard labor precautions given to return to L&D or call the office for problems. 3. Continue routine prenatal care. 4. Dr. Logan Bores consulted on plan of care  Pt seen by me in [facility] triage.    Doreene Burke, CNM

## 2022-10-19 NOTE — Progress Notes (Signed)
Pt discharged in stable condition with husband. Pt given discharge instructions including follow up care and labor precautions. Pt verbalizes understanding and has no questions.

## 2022-10-22 NOTE — Plan of Care (Signed)
CHL Tonsillectomy/Adenoidectomy, Postoperative PEDS care plan entered in error.

## 2022-11-08 ENCOUNTER — Encounter (HOSPITAL_COMMUNITY): Payer: Self-pay | Admitting: Obstetrics and Gynecology

## 2022-11-08 ENCOUNTER — Inpatient Hospital Stay (HOSPITAL_COMMUNITY)
Admission: AD | Admit: 2022-11-08 | Discharge: 2022-11-09 | Disposition: A | Discharge status: Home / Self Care | Payer: BC Managed Care – PPO | Attending: Obstetrics and Gynecology | Admitting: Obstetrics and Gynecology

## 2022-11-08 DIAGNOSIS — Z3689 Encounter for other specified antenatal screening: Secondary | ICD-10-CM

## 2022-11-08 DIAGNOSIS — O10913 Unspecified pre-existing hypertension complicating pregnancy, third trimester: Secondary | ICD-10-CM | POA: Insufficient documentation

## 2022-11-08 DIAGNOSIS — O26893 Other specified pregnancy related conditions, third trimester: Secondary | ICD-10-CM | POA: Diagnosis not present

## 2022-11-08 DIAGNOSIS — O368130 Decreased fetal movements, third trimester, not applicable or unspecified: Secondary | ICD-10-CM | POA: Diagnosis not present

## 2022-11-08 DIAGNOSIS — O133 Gestational [pregnancy-induced] hypertension without significant proteinuria, third trimester: Secondary | ICD-10-CM | POA: Diagnosis not present

## 2022-11-08 DIAGNOSIS — Z3A36 36 weeks gestation of pregnancy: Secondary | ICD-10-CM | POA: Insufficient documentation

## 2022-11-08 LAB — URINALYSIS, ROUTINE W REFLEX MICROSCOPIC
Bilirubin Urine: NEGATIVE
Glucose, UA: NEGATIVE mg/dL
Hgb urine dipstick: NEGATIVE
Ketones, ur: NEGATIVE mg/dL
Leukocytes,Ua: NEGATIVE
Nitrite: NEGATIVE
Protein, ur: NEGATIVE mg/dL
Specific Gravity, Urine: 1.006 (ref 1.005–1.030)
pH: 7 (ref 5.0–8.0)

## 2022-11-08 LAB — PROTEIN / CREATININE RATIO, URINE
Creatinine, Urine: 32 mg/dL
Total Protein, Urine: <6 mg/dL

## 2022-11-08 NOTE — MAU Note (Addendum)
.  April Conner is a 27 y.o. at [redacted]w[redacted]d here in MAU reporting:   Less fetal movement throughout the day, patient reports no movement for "past few hours" unable to give a time. Patient reports felt movement around 1500-1600 but reports was "weaker" than she thought. Patient also reports increased blood pressure, denies high blood pressure throughout the pregnancy reports at home BP was 158/93.    Denies HA, visual disturbances or RUQ pain at this time   Denies VB, LOF  Onset of complaint: Today  Pain score: 0/10 There were no vitals filed for this visit.   UVO:[id number] straight back to room  Lab orders placed from triage:

## 2022-11-08 NOTE — MAU Provider Note (Signed)
Chief Complaint:  No chief complaint on file.   Event Date/Time   First Provider Initiated Contact with Patient 11/08/22 2244     HPI: April Conner is a 27 y.o. G1P0 at 37w3dwho presents to maternity admissions reporting decreased fetal movement.  Noted to have elevated blood pressure.  States has had a couple of them at home in recent weeks.  No official diagnosis in office. . She reports good fetal movement, denies LOF, vaginal bleeding, h/a, n/v, diarrhea, constipation or fever/chills.  She denies headache, visual changes or RUQ abdominal pain.  Hypertension This is a new problem. The current episode started today. Pertinent negatives include no blurred vision, chest pain or headaches. There are no known risk factors for coronary artery disease. Past treatments include nothing. There are no compliance problems.    RN note:    .Aikam A Chou is a 27 y.o. at [redacted]w[redacted]d here in MAU reporting:    Less fetal movement throughout the day, patient reports no movement for "past few hours" unable to give a time. Patient reports felt movement around 1500-1600 but reports was "weaker" than she thought. Patient also reports increased blood pressure, denies high blood pressure throughout the pregnancy reports at home BP was 158/93.       Past Medical History: Past Medical History:  Diagnosis Date   ADHD    Depression     Past obstetric history: OB History  Gravida Para Term Preterm AB Living  1            SAB IAB Ectopic Multiple Live Births               # Outcome Date GA Lbr Len/2nd Weight Sex Type Anes PTL Lv  1 Current             Past Surgical History: Past Surgical History:  Procedure Laterality Date   WISDOM TOOTH EXTRACTION  2012    Family History: Family History  Problem Relation Age of Onset   Lung cancer Maternal Grandmother    Heart Problems Maternal Grandfather     Social History: Social History   Tobacco Use   Smoking status: Never   Smokeless tobacco:  Never  Vaping Use   Vaping status: Never Used  Substance Use Topics   Alcohol use: No   Drug use: No    Allergies: No Known Allergies  Meds:  Medications Prior to Admission  Medication Sig Dispense Refill Last Dose   Prenatal Vit-Fe Fumarate-FA (MULTIVITAMIN-PRENATAL) 27-0.8 MG TABS tablet Take 1 tablet by mouth daily at 12 noon.   11/07/2022   cetirizine (ZYRTEC) 10 MG tablet Take 10 mg by mouth daily as needed for allergies. (Patient not taking: Reported on 10/19/2022)      ferrous sulfate 325 (65 FE) MG tablet Take 325 mg by mouth daily with breakfast.      magnesium oxide (MAG-OX) 400 MG tablet Take 400 mg by mouth daily.      Melatonin 3 MG TABS Take by mouth.      ondansetron (ZOFRAN) 4 MG tablet Take 1 tablet (4 mg total) by mouth every 6 (six) hours. (Patient not taking: Reported on 10/19/2022) 12 tablet 0    riboflavin (VITAMIN B-2) 100 MG TABS tablet Take 100 mg by mouth daily.       I have reviewed patient's Past Medical Hx, Surgical Hx, Family Hx, Social Hx, medications and allergies.   ROS:  Review of Systems  Eyes:  Negative for blurred vision.  Cardiovascular:  Negative for chest pain.  Neurological:  Negative for headaches.   Other systems negative  Physical Exam  Patient Vitals for the past 24 hrs:  BP Temp Temp src Pulse Resp SpO2 Height Weight  11/08/22 2236 (!) 145/88 98.8 F (37.1 C) Oral 98 18 96 % -- --  11/08/22 2226 -- -- -- -- -- -- 5\' 1"  (1.549 m) 88.4 kg   Constitutional: Well-developed, well-nourished female in no acute distress.  Cardiovascular: normal rate  Respiratory: normal effort,  GI: Abd soft, non-tender, gravid appropriate for gestational age.   No rebound or guarding. MS: Extremities nontender, no edema, normal ROM Neurologic: Alert and oriented x 4.  GU: Neg CVAT.  FHT:  Baseline 140 , moderate variability, accelerations present, no decelerations Contractions: Occasional    Labs: Results for orders placed or performed during  the hospital encounter of 11/08/22 (from the past 24 hour(s))  Urinalysis, Routine w reflex microscopic -Urine, Clean Catch     Status: Abnormal   Collection Time: 11/08/22 10:37 PM  Result Value Ref Range   Color, Urine STRAW (A) YELLOW   APPearance CLEAR CLEAR   Specific Gravity, Urine 1.006 1.005 - 1.030   pH 7.0 5.0 - 8.0   Glucose, UA NEGATIVE NEGATIVE mg/dL   Hgb urine dipstick NEGATIVE NEGATIVE   Bilirubin Urine NEGATIVE NEGATIVE   Ketones, ur NEGATIVE NEGATIVE mg/dL   Protein, ur NEGATIVE NEGATIVE mg/dL   Nitrite NEGATIVE NEGATIVE   Leukocytes,Ua NEGATIVE NEGATIVE  Protein / creatinine ratio, urine     Status: None   Collection Time: 11/08/22 10:37 PM  Result Value Ref Range   Creatinine, Urine 32 mg/dL   Total Protein, Urine <6 mg/dL   Protein Creatinine Ratio        0.00 - 0.15 mg/mg[Cre]  CBC     Status: Abnormal   Collection Time: 11/08/22 11:41 PM  Result Value Ref Range   WBC 10.3 4.0 - 10.5 K/uL   RBC 3.73 (L) 3.87 - 5.11 MIL/uL   Hemoglobin 9.1 (L) 12.0 - 15.0 g/dL   HCT 95.6 (L) 21.3 - 08.6 %   MCV 76.7 (L) 80.0 - 100.0 fL   MCH 24.4 (L) 26.0 - 34.0 pg   MCHC 31.8 30.0 - 36.0 g/dL   RDW 57.8 46.9 - 62.9 %   Platelets 225 150 - 400 K/uL   nRBC 0.0 0.0 - 0.2 %  Comprehensive metabolic panel     Status: Abnormal   Collection Time: 11/08/22 11:41 PM  Result Value Ref Range   Sodium 135 135 - 145 mmol/L   Potassium 3.0 (L) 3.5 - 5.1 mmol/L   Chloride 106 98 - 111 mmol/L   CO2 20 (L) 22 - 32 mmol/L   Glucose, Bld 102 (H) 70 - 99 mg/dL   BUN 8 6 - 20 mg/dL   Creatinine, Ser 5.28 0.44 - 1.00 mg/dL   Calcium 8.8 (L) 8.9 - 10.3 mg/dL   Total Protein 5.6 (L) 6.5 - 8.1 g/dL   Albumin 2.3 (L) 3.5 - 5.0 g/dL   AST 14 (L) 15 - 41 U/L   ALT 12 0 - 44 U/L   Alkaline Phosphatase 129 (H) 38 - 126 U/L   Total Bilirubin 0.3 <1.2 mg/dL   GFR, Estimated >41 >32 mL/min   Anion gap 9 5 - 15       Imaging:  No results found.  MAU Course/MDM: I have reviewed the  triage vital signs and the nursing notes.   Pertinent  labs & imaging results that were available during my care of the patient were reviewed by me and considered in my medical decision making (see chart for details).      I have reviewed her medical records including past results, notes and treatments.   I have ordered labs and reviewed results.  NST reviewed Consult Dr Timothy Lasso with presentation, exam findings and test results. She will update office Treatments in MAU included EFM  Baby is moving well now Reviewed lab results and diagnosis of gestational hypertension. .    Assessment: Single IUP at [redacted]w[redacted]d Decreased fetal movement, now moving Reactive nonstress test New Gestational Hypertension  Plan: Discharge home Preeclampsia precautions Has appt on Friday for evaluation Labor precautions and fetal kick counts Follow up in Office for prenatal visits and recheck Encouraged to return if she develops worsening of symptoms, increase in pain, fever, or other concerning symptoms.   Pt stable at time of discharge.  Wynelle Bourgeois CNM, MSN Certified Nurse-Midwife 11/08/2022 10:44 PM

## 2022-11-09 DIAGNOSIS — O133 Gestational [pregnancy-induced] hypertension without significant proteinuria, third trimester: Secondary | ICD-10-CM

## 2022-11-09 DIAGNOSIS — Z3689 Encounter for other specified antenatal screening: Secondary | ICD-10-CM

## 2022-11-09 DIAGNOSIS — O26893 Other specified pregnancy related conditions, third trimester: Secondary | ICD-10-CM | POA: Diagnosis not present

## 2022-11-09 DIAGNOSIS — O368130 Decreased fetal movements, third trimester, not applicable or unspecified: Secondary | ICD-10-CM

## 2022-11-09 DIAGNOSIS — Z3A36 36 weeks gestation of pregnancy: Secondary | ICD-10-CM

## 2022-11-09 LAB — COMPREHENSIVE METABOLIC PANEL
ALT: 12 U/L (ref 0–44)
AST: 14 U/L — ABNORMAL LOW (ref 15–41)
Albumin: 2.3 g/dL — ABNORMAL LOW (ref 3.5–5.0)
Alkaline Phosphatase: 129 U/L — ABNORMAL HIGH (ref 38–126)
Anion gap: 9 (ref 5–15)
BUN: 8 mg/dL (ref 6–20)
CO2: 20 mmol/L — ABNORMAL LOW (ref 22–32)
Calcium: 8.8 mg/dL — ABNORMAL LOW (ref 8.9–10.3)
Chloride: 106 mmol/L (ref 98–111)
Creatinine, Ser: 0.55 mg/dL (ref 0.44–1.00)
GFR, Estimated: >60 mL/min (ref >60)
Glucose, Bld: 102 mg/dL — ABNORMAL HIGH (ref 70–99)
Potassium: 3 mmol/L — ABNORMAL LOW (ref 3.5–5.1)
Sodium: 135 mmol/L (ref 135–145)
Total Bilirubin: 0.3 mg/dL (ref <1.2)
Total Protein: 5.6 g/dL — ABNORMAL LOW (ref 6.5–8.1)

## 2022-11-09 LAB — CBC
HCT: 28.6 % — ABNORMAL LOW (ref 36.0–46.0)
Hemoglobin: 9.1 g/dL — ABNORMAL LOW (ref 12.0–15.0)
MCH: 24.4 pg — ABNORMAL LOW (ref 26.0–34.0)
MCHC: 31.8 g/dL (ref 30.0–36.0)
MCV: 76.7 fL — ABNORMAL LOW (ref 80.0–100.0)
Platelets: 225 K/uL (ref 150–400)
RBC: 3.73 MIL/uL — ABNORMAL LOW (ref 3.87–5.11)
RDW: 15.2 % (ref 11.5–15.5)
WBC: 10.3 K/uL (ref 4.0–10.5)
nRBC: 0 % (ref 0.0–0.2)

## 2022-11-13 ENCOUNTER — Inpatient Hospital Stay (HOSPITAL_COMMUNITY): Payer: BC Managed Care – PPO | Admitting: Anesthesiology

## 2022-11-13 ENCOUNTER — Encounter: Payer: Self-pay | Admitting: Obstetrics and Gynecology

## 2022-11-13 ENCOUNTER — Other Ambulatory Visit: Payer: Self-pay

## 2022-11-13 ENCOUNTER — Encounter (HOSPITAL_COMMUNITY): Payer: Self-pay | Admitting: Obstetrics and Gynecology

## 2022-11-13 ENCOUNTER — Other Ambulatory Visit: Payer: Self-pay | Admitting: Obstetrics and Gynecology

## 2022-11-13 ENCOUNTER — Inpatient Hospital Stay (HOSPITAL_COMMUNITY)
Admission: AD | Admit: 2022-11-13 | Discharge: 2022-11-16 | DRG: 768 | Disposition: A | Discharge status: Home / Self Care | Payer: BC Managed Care – PPO | Attending: Obstetrics and Gynecology | Admitting: Obstetrics and Gynecology

## 2022-11-13 DIAGNOSIS — O99824 Streptococcus B carrier state complicating childbirth: Secondary | ICD-10-CM | POA: Diagnosis present

## 2022-11-13 DIAGNOSIS — O133 Gestational [pregnancy-induced] hypertension without significant proteinuria, third trimester: Principal | ICD-10-CM | POA: Diagnosis present

## 2022-11-13 DIAGNOSIS — Z148 Genetic carrier of other disease: Secondary | ICD-10-CM | POA: Diagnosis not present

## 2022-11-13 DIAGNOSIS — F909 Attention-deficit hyperactivity disorder, unspecified type: Secondary | ICD-10-CM | POA: Diagnosis present

## 2022-11-13 DIAGNOSIS — Z3A37 37 weeks gestation of pregnancy: Secondary | ICD-10-CM

## 2022-11-13 DIAGNOSIS — O134 Gestational [pregnancy-induced] hypertension without significant proteinuria, complicating childbirth: Principal | ICD-10-CM | POA: Diagnosis present

## 2022-11-13 DIAGNOSIS — O99344 Other mental disorders complicating childbirth: Secondary | ICD-10-CM | POA: Diagnosis present

## 2022-11-13 DIAGNOSIS — F419 Anxiety disorder, unspecified: Secondary | ICD-10-CM | POA: Diagnosis present

## 2022-11-13 DIAGNOSIS — O9081 Anemia of the puerperium: Secondary | ICD-10-CM | POA: Diagnosis present

## 2022-11-13 DIAGNOSIS — D62 Acute posthemorrhagic anemia: Secondary | ICD-10-CM | POA: Diagnosis present

## 2022-11-13 DIAGNOSIS — F32A Depression, unspecified: Secondary | ICD-10-CM | POA: Diagnosis present

## 2022-11-13 LAB — COMPREHENSIVE METABOLIC PANEL
ALT: 13 U/L (ref 0–44)
AST: 17 U/L (ref 15–41)
Albumin: 2.6 g/dL — ABNORMAL LOW (ref 3.5–5.0)
Alkaline Phosphatase: 157 U/L — ABNORMAL HIGH (ref 38–126)
Anion gap: 11 (ref 5–15)
BUN: 7 mg/dL (ref 6–20)
CO2: 19 mmol/L — ABNORMAL LOW (ref 22–32)
Calcium: 8.9 mg/dL (ref 8.9–10.3)
Chloride: 105 mmol/L (ref 98–111)
Creatinine, Ser: 0.52 mg/dL (ref 0.44–1.00)
GFR, Estimated: >60 mL/min (ref >60)
Glucose, Bld: 92 mg/dL (ref 70–99)
Potassium: 3.4 mmol/L — ABNORMAL LOW (ref 3.5–5.1)
Sodium: 135 mmol/L (ref 135–145)
Total Bilirubin: <0.2 mg/dL (ref <1.2)
Total Protein: 6.2 g/dL — ABNORMAL LOW (ref 6.5–8.1)

## 2022-11-13 LAB — CBC WITH DIFFERENTIAL/PLATELET
Abs Immature Granulocytes: 0.11 10*3/uL — ABNORMAL HIGH (ref 0.00–0.07)
Basophils Absolute: 0 10*3/uL (ref 0.0–0.1)
Basophils Relative: 0 %
Eosinophils Absolute: 0.1 10*3/uL (ref 0.0–0.5)
Eosinophils Relative: 1 %
HCT: 29.8 % — ABNORMAL LOW (ref 36.0–46.0)
Hemoglobin: 9.5 g/dL — ABNORMAL LOW (ref 12.0–15.0)
Immature Granulocytes: 1 %
Lymphocytes Relative: 17 %
Lymphs Abs: 2.3 10*3/uL (ref 0.7–4.0)
MCH: 24.7 pg — ABNORMAL LOW (ref 26.0–34.0)
MCHC: 31.9 g/dL (ref 30.0–36.0)
MCV: 77.6 fL — ABNORMAL LOW (ref 80.0–100.0)
Monocytes Absolute: 0.7 10*3/uL (ref 0.1–1.0)
Monocytes Relative: 6 %
Neutro Abs: 10.2 10*3/uL — ABNORMAL HIGH (ref 1.7–7.7)
Neutrophils Relative %: 75 %
Platelets: 211 10*3/uL (ref 150–400)
RBC: 3.84 MIL/uL — ABNORMAL LOW (ref 3.87–5.11)
RDW: 15.6 % — ABNORMAL HIGH (ref 11.5–15.5)
WBC: 13.5 10*3/uL — ABNORMAL HIGH (ref 4.0–10.5)
nRBC: 0 % (ref 0.0–0.2)

## 2022-11-13 LAB — CBC
HCT: 31.2 % — ABNORMAL LOW (ref 36.0–46.0)
Hemoglobin: 9.7 g/dL — ABNORMAL LOW (ref 12.0–15.0)
MCH: 23.8 pg — ABNORMAL LOW (ref 26.0–34.0)
MCHC: 31.1 g/dL (ref 30.0–36.0)
MCV: 76.5 fL — ABNORMAL LOW (ref 80.0–100.0)
Platelets: 239 10*3/uL (ref 150–400)
RBC: 4.08 MIL/uL (ref 3.87–5.11)
RDW: 15.6 % — ABNORMAL HIGH (ref 11.5–15.5)
WBC: 12.4 10*3/uL — ABNORMAL HIGH (ref 4.0–10.5)
nRBC: 0 % (ref 0.0–0.2)

## 2022-11-13 LAB — OB RESULTS CONSOLE GBS: GBS: POSITIVE

## 2022-11-13 LAB — TYPE AND SCREEN
ABO/RH(D): A POS
Antibody Screen: NEGATIVE

## 2022-11-13 LAB — PROTEIN / CREATININE RATIO, URINE
Creatinine, Urine: 57 mg/dL
Protein Creatinine Ratio: 0.19 mg/mg{creat} — ABNORMAL HIGH (ref 0.00–0.15)
Total Protein, Urine: 11 mg/dL

## 2022-11-13 MED ORDER — EPHEDRINE 5 MG/ML INJ: 10.0000 mg | INTRAVENOUS | Status: DC | PRN

## 2022-11-13 MED ORDER — OXYTOCIN BOLUS FROM INFUSION
333.0000 mL | Freq: Once | INTRAVENOUS | Status: AC
  Administered 2022-11-14: 333 mL via INTRAVENOUS

## 2022-11-13 MED ORDER — LACTATED RINGERS IV SOLN: INTRAVENOUS | Status: DC

## 2022-11-13 MED ORDER — FENTANYL CITRATE (PF) 100 MCG/2ML IJ SOLN
50.0000 ug | INTRAMUSCULAR | Status: DC | PRN
  Administered 2022-11-13 (×3): 100 ug via INTRAVENOUS
  Filled 2022-11-13 (×3): qty 2

## 2022-11-13 MED ORDER — LACTATED RINGERS IV SOLN
500.0000 mL | Freq: Once | INTRAVENOUS | Status: AC
Start: 2022-11-13 — End: 2022-11-13
  Administered 2022-11-13: 500 mL via INTRAVENOUS

## 2022-11-13 MED ORDER — MISOPROSTOL 25 MCG QUARTER TABLET
25.0000 ug | ORAL_TABLET | ORAL | Status: DC | PRN
  Administered 2022-11-13: 25 ug via VAGINAL
  Filled 2022-11-13: qty 1

## 2022-11-13 MED ORDER — SOD CITRATE-CITRIC ACID 500-334 MG/5ML PO SOLN
30.0000 mL | ORAL | Status: DC | PRN
Start: 2022-11-13 — End: 2022-11-14

## 2022-11-13 MED ORDER — LIDOCAINE-EPINEPHRINE (PF) 2 %-1:200000 IJ SOLN
INTRAMUSCULAR | Status: DC | PRN
  Administered 2022-11-13: 5 mL via EPIDURAL

## 2022-11-13 MED ORDER — FENTANYL-BUPIVACAINE-NACL 0.5-0.125-0.9 MG/250ML-% EP SOLN
12.0000 mL/h | EPIDURAL | Status: DC | PRN
  Administered 2022-11-13: 12 mL/h via EPIDURAL
  Filled 2022-11-13: qty 250

## 2022-11-13 MED ORDER — LABETALOL HCL 5 MG/ML IV SOLN
20.0000 mg | INTRAVENOUS | Status: DC | PRN
Start: 2022-11-13 — End: 2022-11-14

## 2022-11-13 MED ORDER — PENICILLIN G POT IN DEXTROSE 60000 UNIT/ML IV SOLN
3.0000 10*6.[IU] | INTRAVENOUS | Status: DC
  Administered 2022-11-13 – 2022-11-14 (×4): 3 10*6.[IU] via INTRAVENOUS
  Filled 2022-11-13 (×4): qty 50

## 2022-11-13 MED ORDER — ACETAMINOPHEN 325 MG PO TABS
650.0000 mg | ORAL_TABLET | ORAL | Status: DC | PRN
Start: 2022-11-13 — End: 2022-11-14

## 2022-11-13 MED ORDER — LABETALOL HCL 5 MG/ML IV SOLN: 80.0000 mg | INTRAVENOUS | Status: DC | PRN

## 2022-11-13 MED ORDER — OXYTOCIN-SODIUM CHLORIDE 30-0.9 UT/500ML-% IV SOLN
2.5000 [IU]/h | INTRAVENOUS | Status: DC
  Filled 2022-11-13: qty 500

## 2022-11-13 MED ORDER — ONDANSETRON HCL 4 MG/2ML IJ SOLN
4.0000 mg | Freq: Four times a day (QID) | INTRAMUSCULAR | Status: DC | PRN
Start: 2022-11-13 — End: 2022-11-14
  Administered 2022-11-13 – 2022-11-14 (×2): 4 mg via INTRAVENOUS
  Filled 2022-11-13 (×2): qty 2

## 2022-11-13 MED ORDER — SODIUM CHLORIDE 0.9 % IV SOLN
5.0000 10*6.[IU] | Freq: Once | INTRAVENOUS | Status: AC
  Administered 2022-11-13: 5 10*6.[IU] via INTRAVENOUS
  Filled 2022-11-13: qty 5

## 2022-11-13 MED ORDER — LIDOCAINE HCL (PF) 1 % IJ SOLN
30.0000 mL | INTRAMUSCULAR | Status: AC | PRN
  Administered 2022-11-14: 30 mL via SUBCUTANEOUS
  Filled 2022-11-13: qty 30

## 2022-11-13 MED ORDER — SODIUM BICARBONATE 8.4 % IV SOLN: INTRAVENOUS | Status: DC | PRN

## 2022-11-13 MED ORDER — TERBUTALINE SULFATE 1 MG/ML IJ SOLN: 0.2500 mg | Freq: Once | INTRAMUSCULAR | Status: DC | PRN

## 2022-11-13 MED ORDER — PHENYLEPHRINE 80 MCG/ML (10ML) SYRINGE FOR IV PUSH (FOR BLOOD PRESSURE SUPPORT): 80.0000 ug | PREFILLED_SYRINGE | INTRAVENOUS | Status: DC | PRN

## 2022-11-13 MED ORDER — LACTATED RINGERS IV SOLN: 500.0000 mL | INTRAVENOUS | Status: DC | PRN

## 2022-11-13 MED ORDER — OXYCODONE-ACETAMINOPHEN 5-325 MG PO TABS: 1.0000 | ORAL_TABLET | ORAL | Status: DC | PRN

## 2022-11-13 MED ORDER — OXYCODONE-ACETAMINOPHEN 5-325 MG PO TABS: 2.0000 | ORAL_TABLET | ORAL | Status: DC | PRN

## 2022-11-13 MED ORDER — OXYTOCIN-SODIUM CHLORIDE 30-0.9 UT/500ML-% IV SOLN
1.0000 m[IU]/min | INTRAVENOUS | Status: DC
  Administered 2022-11-13: 1 m[IU]/min via INTRAVENOUS

## 2022-11-13 MED ORDER — LABETALOL HCL 5 MG/ML IV SOLN
40.0000 mg | INTRAVENOUS | Status: DC | PRN
Start: 2022-11-13 — End: 2022-11-14

## 2022-11-13 MED ORDER — HYDRALAZINE HCL 20 MG/ML IJ SOLN: 10.0000 mg | INTRAMUSCULAR | Status: DC | PRN

## 2022-11-13 MED ORDER — DIPHENHYDRAMINE HCL 50 MG/ML IJ SOLN: 12.5000 mg | INTRAMUSCULAR | Status: DC | PRN

## 2022-11-13 MED ORDER — OXYTOCIN-SODIUM CHLORIDE 30-0.9 UT/500ML-% IV SOLN: 1.0000 m[IU]/min | INTRAVENOUS | Status: DC

## 2022-11-13 NOTE — H&P (Signed)
27 y.o. G1P0 @ [redacted]w[redacted]d presents from the office for induction of labor due to new onset hypertension in pregnancy.  She was seen in MAU on 11/6 for decreased fetal movement.  Had a few elevated BPs at that time in the 130-140s/90s.  Follow up BP check in the office on 11/8 was normal.  Presented again today for a routine visit and BP was 160/96 with repeat 140/90.  She now meets criteria for gestational hypertension. She denies headache, visual changes or RUQ pain.  Otherwise has good fetal movement and no bleeding.  Pregnancy complicated by: Anxiety, depression, ADHD:  stopped adderall, lexapro and propranolol with pregnancy. Silent carrier for alpha thalassemia.  Carrier screening was recommended for FOB, but he never completed.  Iron deficiency anemia: hgb 9.7 at 28 weeks.  On PO iron with repeat of 10.1.  Failed 1hr glucose tolerance test.  Passed 3 hour gtt  Past Medical History:  Diagnosis Date   ADHD    Depression     Past Surgical History:  Procedure Laterality Date   WISDOM TOOTH EXTRACTION  2012    OB History  Gravida Para Term Preterm AB Living  1            SAB IAB Ectopic Multiple Live Births               # Outcome Date GA Lbr Len/2nd Weight Sex Type Anes PTL Lv  1 Current             Social History   Socioeconomic History   Marital status: Married    Spouse name: Not on file   Number of children: Not on file   Years of education: Not on file   Highest education level: Not on file  Occupational History   Not on file  Tobacco Use   Smoking status: Never   Smokeless tobacco: Never  Vaping Use   Vaping status: Never Used  Substance and Sexual Activity   Alcohol use: No   Drug use: No   Sexual activity: Not Currently  Other Topics Concern   Not on file  Social History Narrative   Not on file   Social Determinants of Health   Financial Resource Strain: Low Risk  (07/21/2020)   Received from [facility], [facility]   Overall Financial Resource  Strain (CARDIA)    Difficulty of Paying Living Expenses: Not hard at all  Food Insecurity: No Food Insecurity (07/21/2020)   Received from [facility], [facility]   Hunger Vital Sign    Worried About Running Out of Food in the Last Year: Never true    Ran Out of Food in the Last Year: Never true  Transportation Needs: No Transportation Needs (07/21/2020)   Received from [facility], [facility]   [facility] - Transportation    Lack of Transportation (Medical): No    Lack of Transportation (Non-Medical): No  Physical Activity: Not on file  Stress: Not on file  Social Connections: Not on file  Intimate Partner Violence: Not on file   Patient has no known allergies.    Prenatal Transfer Tool  Maternal Diabetes: No Genetic Screening: Abnormal:  Results: Other: low risk NIPT, see above for carrier screening Maternal Ultrasounds/Referrals: Normal Fetal Ultrasounds or other Referrals:  None Maternal Substance Abuse:  No Significant Maternal Medications:  None Significant Maternal Lab Results: Group B Strep positive Vaccines: received flu, tdap, RSV (10/19/22)  ABO, Rh: A/Positive/-- (05/14 0000) Antibody:  Negative (05/14 0000) Rubella: Immune (05/14 0000) RPR: Nonreactive (05/14 0000)  HBsAg: Negative (05/14 0000)  HIV: Non-reactive (05/14 0000)  GBS:   Positive    Vitals:   11/13/22 1239  BP: (!) 140/88  Pulse: 98  Temp: 98.5 F (36.9 C)     General:  NAD Abdomen:  soft, gravid, EFW 6.5# Ex:  trace edema SVE:  1/50/-3/soft/posterior/vertex.  Foley balloon placed and filled with 60 mL fluid FHTs:  120s, moderate variability, + accelerations Toco:  quiet   A/P   27 y.o. G1P0 [redacted]w[redacted]d presents for induction of labor for new onset GHTN at term Elevated blood pressure: will check labs, asymptomatic.  Monitor closely for signs/symptoms of severe preeclampsia IOL: cervix unfavorable.  Foley balloon + misoprostol GBS positive: start penicillin  Cali Hope GEFFEL  Braylon Lemmons

## 2022-11-13 NOTE — Plan of Care (Signed)
  Problem: Education: Goal: Knowledge of General Education information will improve Description: Including pain rating scale, medication(s)/side effects and non-pharmacologic comfort measures Outcome: Progressing   Problem: Health Behavior/Discharge Planning: Goal: Ability to manage health-related needs will improve Outcome: Progressing   Problem: Clinical Measurements: Goal: Ability to maintain clinical measurements within normal limits will improve Outcome: Progressing Goal: Will remain free from infection Outcome: Progressing Goal: Diagnostic test results will improve Outcome: Progressing Goal: Respiratory complications will improve Outcome: Progressing Goal: Cardiovascular complication will be avoided Outcome: Progressing   Problem: Activity: Goal: Risk for activity intolerance will decrease Outcome: Progressing   Problem: Nutrition: Goal: Adequate nutrition will be maintained Outcome: Progressing   Problem: Coping: Goal: Level of anxiety will decrease Outcome: Progressing   Problem: Elimination: Goal: Will not experience complications related to bowel motility Outcome: Progressing Goal: Will not experience complications related to urinary retention Outcome: Progressing   Problem: Pain Management: Goal: General experience of comfort will improve Outcome: Progressing   Problem: Safety: Goal: Ability to remain free from injury will improve Outcome: Progressing   Problem: Skin Integrity: Goal: Risk for impaired skin integrity will decrease Outcome: Progressing   Problem: Education: Goal: Knowledge of disease or condition will improve Outcome: Progressing Goal: Knowledge of the prescribed therapeutic regimen will improve Outcome: Progressing   Problem: Fluid Volume: Goal: Peripheral tissue perfusion will improve Outcome: Progressing   Problem: Clinical Measurements: Goal: Complications related to disease process, condition or treatment will be avoided or  minimized Outcome: Progressing   Problem: Education: Goal: Knowledge of Childbirth will improve Outcome: Progressing Goal: Ability to make informed decisions regarding treatment and plan of care will improve Outcome: Progressing Goal: Ability to state and carry out methods to decrease the pain will improve Outcome: Progressing Goal: Individualized Educational Video(s) Outcome: Progressing   Problem: Coping: Goal: Ability to verbalize concerns and feelings about labor and delivery will improve Outcome: Progressing   Problem: Life Cycle: Goal: Ability to make normal progression through stages of labor will improve Outcome: Progressing Goal: Ability to effectively push during vaginal delivery will improve Outcome: Progressing   Problem: Role Relationship: Goal: Will demonstrate positive interactions with the child Outcome: Progressing   Problem: Safety: Goal: Risk of complications during labor and delivery will decrease Outcome: Progressing   Problem: Pain Management: Goal: Relief or control of pain from uterine contractions will improve Outcome: Progressing

## 2022-11-13 NOTE — Progress Notes (Signed)
TC by RN that foley balloon was out at 1930. Patient with increasing contraction pain.  Discussed AROM.  She would like to proceed with epidural first.  Will return when comfortable

## 2022-11-13 NOTE — Progress Notes (Signed)
Comfortable with epidural  BP 114/79   Pulse 94   Temp 98.3 F (36.8 C) (Oral)   Resp 16   Ht 5\' 1"  (1.549 m)   Wt 88 kg   SpO2 98%   BMI 36.67 kg/m   Toco: q3-4 minutes EFM: 130s, moderate variability, category 1 SVE: 5/50/-2, AROM clear fluid  A/P: G1 @ [redacted]w[redacted]d with IOL for GHTN IOL: still in latent labor, continue pitocin GHTN: BPs mild range GBS positive: penicillin

## 2022-11-13 NOTE — Anesthesia Procedure Notes (Signed)
Epidural Patient location during procedure: OB Start time: 11/13/2022 10:30 PM End time: 11/13/2022 10:40 PM  Staffing Anesthesiologist: Elmer Picker, MD Performed: anesthesiologist   Preanesthetic Checklist Completed: patient identified, IV checked, risks and benefits discussed, monitors and equipment checked, pre-op evaluation and timeout performed  Epidural Patient position: sitting Prep: DuraPrep and site prepped and draped Patient monitoring: continuous pulse ox, blood pressure, heart rate and cardiac monitor Approach: midline Location: L3-L4 Injection technique: LOR air  Needle:  Needle type: Tuohy  Needle gauge: 17 G Needle length: 9 cm Needle insertion depth: 6 cm Catheter type: closed end flexible Catheter size: 19 Gauge Catheter at skin depth: 11 cm Test dose: negative  Assessment Sensory level: T8 Events: blood not aspirated, no cerebrospinal fluid, injection not painful, no injection resistance, no paresthesia and negative IV test  Additional Notes Patient identified. Risks/Benefits/Options discussed with patient including but not limited to bleeding, infection, nerve damage, paralysis, failed block, incomplete pain control, headache, blood pressure changes, nausea, vomiting, reactions to medication both or allergic, itching and postpartum back pain. Confirmed with bedside nurse the patient's most recent platelet count. Confirmed with patient that they are not currently taking any anticoagulation, have any bleeding history or any family history of bleeding disorders. Patient expressed understanding and wished to proceed. All questions were answered. Sterile technique was used throughout the entire procedure. Please see nursing notes for vital signs. Test dose was given through epidural catheter and negative prior to continuing to dose epidural or start infusion. Warning signs of high block given to the patient including shortness of breath, tingling/numbness in  hands, complete motor block, or any concerning symptoms with instructions to call for help. Patient was given instructions on fall risk and not to get out of bed. All questions and concerns addressed with instructions to call with any issues or inadequate analgesia.  Reason for block:procedure for pain

## 2022-11-13 NOTE — Anesthesia Preprocedure Evaluation (Signed)
Anesthesia Evaluation  Patient identified by MRN, date of birth, ID band Patient awake    Reviewed: Allergy & Precautions, NPO status , Patient's Chart, lab work & pertinent test results  Airway Mallampati: II  TM Distance: >3 FB Neck ROM: Full    Dental no notable dental hx.    Pulmonary asthma    Pulmonary exam normal breath sounds clear to auscultation       Cardiovascular hypertension (gHTN), Normal cardiovascular exam Rhythm:Regular Rate:Normal     Neuro/Psych  Headaches PSYCHIATRIC DISORDERS  Depression       GI/Hepatic negative GI ROS, Neg liver ROS,,,  Endo/Other  negative endocrine ROS    Renal/GU negative Renal ROS  negative genitourinary   Musculoskeletal negative musculoskeletal ROS (+)    Abdominal   Peds  (+) ADHD Hematology  (+) Blood dyscrasia, anemia Lab Results      Component                Value               Date                      WBC                      13.5 (H)            11/13/2022                HGB                      9.5 (L)             11/13/2022                HCT                      29.8 (L)            11/13/2022                MCV                      77.6 (L)            11/13/2022                PLT                      211                 11/13/2022              Anesthesia Other Findings IOL for gHTN  Reproductive/Obstetrics (+) Pregnancy                             Anesthesia Physical Anesthesia Plan  ASA: 3  Anesthesia Plan: Epidural   Post-op Pain Management:    Induction:   PONV Risk Score and Plan: Treatment may vary due to age or medical condition  Airway Management Planned: Natural Airway  Additional Equipment:   Intra-op Plan:   Post-operative Plan:   Informed Consent: I have reviewed the patients History and Physical, chart, labs and discussed the procedure including the risks, benefits and alternatives for the proposed  anesthesia with the patient or authorized representative who has indicated his/her understanding and acceptance.  Plan Discussed with: Anesthesiologist  Anesthesia Plan Comments: (Patient identified. Risks, benefits, options discussed with patient including but not limited to bleeding, infection, nerve damage, paralysis, failed block, incomplete pain control, headache, blood pressure changes, nausea, vomiting, reactions to medication, itching, and post partum back pain. Confirmed with bedside nurse the patient's most recent platelet count. Confirmed with the patient that they are not taking any anticoagulation, have any bleeding history or any family history of bleeding disorders. Patient expressed understanding and wishes to proceed. All questions were answered. )       Anesthesia Quick Evaluation

## 2022-11-14 ENCOUNTER — Encounter (HOSPITAL_COMMUNITY): Payer: Self-pay | Admitting: Obstetrics and Gynecology

## 2022-11-14 LAB — RPR: RPR Ser Ql: NONREACTIVE

## 2022-11-14 MED ORDER — ONDANSETRON HCL 4 MG/2ML IJ SOLN
4.0000 mg | INTRAMUSCULAR | Status: DC | PRN
  Administered 2022-11-15: 4 mg via INTRAVENOUS
  Filled 2022-11-14: qty 2

## 2022-11-14 MED ORDER — PRENATAL MULTIVITAMIN CH
1.0000 | ORAL_TABLET | Freq: Every day | ORAL | Status: DC
Start: 2022-11-14 — End: 2022-11-16
  Administered 2022-11-14 – 2022-11-15 (×2): 1 via ORAL
  Filled 2022-11-14 (×2): qty 1

## 2022-11-14 MED ORDER — OXYCODONE HCL 5 MG PO TABS: 10.0000 mg | ORAL_TABLET | ORAL | Status: DC | PRN

## 2022-11-14 MED ORDER — OXYCODONE-ACETAMINOPHEN 5-325 MG PO TABS: 1.0000 | ORAL_TABLET | Freq: Once | ORAL | Status: DC

## 2022-11-14 MED ORDER — SIMETHICONE 80 MG PO CHEW: 80.0000 mg | CHEWABLE_TABLET | ORAL | Status: DC | PRN

## 2022-11-14 MED ORDER — DIBUCAINE (PERIANAL) 1 % EX OINT
1.0000 | TOPICAL_OINTMENT | CUTANEOUS | Status: DC | PRN
  Administered 2022-11-15: 1 via RECTAL
  Filled 2022-11-14: qty 28

## 2022-11-14 MED ORDER — COCONUT OIL OIL
1.0000 | TOPICAL_OIL | Status: DC | PRN
  Administered 2022-11-14: 1 via TOPICAL

## 2022-11-14 MED ORDER — DIPHENHYDRAMINE HCL 25 MG PO CAPS: 25.0000 mg | ORAL_CAPSULE | Freq: Four times a day (QID) | ORAL | Status: DC | PRN

## 2022-11-14 MED ORDER — SENNOSIDES-DOCUSATE SODIUM 8.6-50 MG PO TABS
2.0000 | ORAL_TABLET | ORAL | Status: DC
  Administered 2022-11-14 – 2022-11-16 (×3): 2 via ORAL
  Filled 2022-11-14 (×3): qty 2

## 2022-11-14 MED ORDER — TRANEXAMIC ACID-NACL 1000-0.7 MG/100ML-% IV SOLN
INTRAVENOUS | Status: AC
  Filled 2022-11-14: qty 100

## 2022-11-14 MED ORDER — WITCH HAZEL-GLYCERIN EX PADS
1.0000 | MEDICATED_PAD | CUTANEOUS | Status: DC | PRN
  Administered 2022-11-15: 1 via TOPICAL

## 2022-11-14 MED ORDER — IBUPROFEN 600 MG PO TABS
600.0000 mg | ORAL_TABLET | Freq: Four times a day (QID) | ORAL | Status: DC
  Administered 2022-11-14 – 2022-11-16 (×6): 600 mg via ORAL
  Filled 2022-11-14 (×8): qty 1

## 2022-11-14 MED ORDER — ONDANSETRON HCL 4 MG PO TABS: 4.0000 mg | ORAL_TABLET | ORAL | Status: DC | PRN

## 2022-11-14 MED ORDER — TRANEXAMIC ACID-NACL 1000-0.7 MG/100ML-% IV SOLN
1000.0000 mg | INTRAVENOUS | Status: AC
  Administered 2022-11-14: 1000 mg via INTRAVENOUS

## 2022-11-14 MED ORDER — BENZOCAINE-MENTHOL 20-0.5 % EX AERO
1.0000 | INHALATION_SPRAY | CUTANEOUS | Status: DC | PRN
  Administered 2022-11-14: 1 via TOPICAL
  Filled 2022-11-14 (×2): qty 56

## 2022-11-14 MED ORDER — TETANUS-DIPHTH-ACELL PERTUSSIS 5-2.5-18.5 LF-MCG/0.5 IM SUSY: 0.5000 mL | PREFILLED_SYRINGE | Freq: Once | INTRAMUSCULAR | Status: DC

## 2022-11-14 MED ORDER — OXYCODONE HCL 5 MG PO TABS
5.0000 mg | ORAL_TABLET | ORAL | Status: DC | PRN
Start: 2022-11-14 — End: 2022-11-16
  Administered 2022-11-14 – 2022-11-15 (×2): 5 mg via ORAL
  Filled 2022-11-14 (×2): qty 1

## 2022-11-14 MED ORDER — ACETAMINOPHEN 325 MG PO TABS
650.0000 mg | ORAL_TABLET | ORAL | Status: DC | PRN
  Administered 2022-11-14 – 2022-11-16 (×2): 650 mg via ORAL
  Filled 2022-11-14 (×3): qty 2

## 2022-11-14 NOTE — Lactation Note (Signed)
This note was copied from a baby's chart. Lactation Consultation Note  Patient Name: April Conner [id number] Date: 11/14/2022 Age:28 hours Reason for consult: Follow-up assessment;Mother's request;Difficult latch;Early term 37-38.6wks P1, MOB informed LC infant had 2-3 episodes of emesis ( large), infant has not been latching well maybe 1-2 minutes maximum Per FOB, family has been doing a lot of skin to skin with infant. MOB was open to trying the football hold position, MOB latched infant on her left breast using the football hold with pillow support, infant was on and off the breast for 8 minutes, afterwards MOB hand express and infant was given 4 mls of colostrum by spoon. Infant was burped and appeared content, MOB was doing skin to skin with infant when [facility] left the room. Infant had 3 voids and 3 stool diapers since birth.   Today's current feeding plan: 1- MOB will continue to work towards latching infant at the breast, by cues, on demand, every 2-3 hours, skin to skin. 2- MOB knows to call for further latch assistance if needed. 3- MOB knows if infant doesn't latch to hand express and give infant back her EBM by spoon on Day 1 of infant's life.   Maternal Data Has patient been taught Hand Expression?: Yes Does the patient have breastfeeding experience prior to this delivery?: No  Feeding Mother's Current Feeding Choice: Breast Milk and Formula  LATCH Score Latch: Repeated attempts needed to sustain latch, nipple held in mouth throughout feeding, stimulation needed to elicit sucking reflex.  Audible Swallowing: A few with stimulation  Type of Nipple: Everted at rest and after stimulation  Comfort (Breast/Nipple): Soft / non-tender  Hold (Positioning): Assistance needed to correctly position infant at breast and maintain latch.  LATCH Score: 7   Lactation Tools Discussed/Used    Interventions Interventions: Skin to skin;Assisted with latch;Hand express;Support  pillows;Adjust position;Position options;Breast massage;Expressed milk;Coconut oil;Education  Discharge    Consult Status Consult Status: Follow-up Date: 11/15/22 Follow-up type: In-patient    Frederico Hamman 11/14/2022, 9:19 PM

## 2022-11-14 NOTE — Progress Notes (Signed)
Post Partum Day 0 Subjective: up ad lib, voiding, tolerating PO, + flatus, and lochia mild. She complaints of pain in perineal region rated at 8/10. She was given ibuprofen in past hour - no relief noted. She has also tried dermoplast and ice to area.   Objective: Blood pressure 136/84, pulse (!) 101, temperature 99 F (37.2 C), temperature source Oral, resp. rate 16, height 5\' 1"  (1.549 m), weight 88 kg, SpO2 97%, unknown if currently breastfeeding.  Physical Exam:  General: NAD, flat affect Lochia: appropriate, light on pad  GU: no swelling, mild induration on left labia, no erythema or tenderness Uterine Fundus: firm Incision: n/a DVT Evaluation: No evidence of DVT seen on physical exam.  Recent Labs    11/13/22 1301 11/13/22 2201  HGB 9.7* 9.5*  HCT 31.2* 29.8*    Assessment/Plan: PPD#0 s/p svd and I?D labial cyst, ghtn - Will give one dose of percocet for pain relief; continue symptomatic relief options - BP stable- no meds - Routine pp care   LOS: 1 day   April Conner April Latausha Flamm, DO 11/14/2022, 12:54 PM

## 2022-11-14 NOTE — Lactation Note (Signed)
This note was copied from a baby's chart. Lactation Consultation Note  Patient Name: April Conner [id number] Date: 11/14/2022 Age:28 hours  Reason for consult: Initial assessment;Primapara;1st time breastfeeding;Early term 37-38.6wks;Mother's request;Breastfeeding assistance  P1, [redacted]w[redacted]d  Initial LC visit to see P1 mother of early term infant. Baby was alert and rooting. Mother requesting breastfeeding assistance. Baby is rooting and, with LC assistance is, latching briefly to the breast. She suckles in short bursts but unable to sustain the latch. Basic breastfeeding education with baby placed in football hold and in laid back position. Baby was then placed skin to skin with mother before she was swaddled for her father to hold.  Mother encouraged to latch baby with feeding cues, place baby skin to skin if not latching, and call for assistance with breastfeeding as needed.   Mother states her feeding plan is to mostly pump and bottle feed. She wants to also try breastfeeding. Parents have requested formula. Mother reports she has breast pumps at home.   Mother given a manual breast pump. Instructed mother on the use, frequency, cleaning of breast pump and storage of breast milk. Coconut oil given and breast shells.     Discussed the process of milk production, "supply and demand" and the importance of breast stimulation and milk removal in order to make an optimal milk supply.  Discussed mother to breastfeed 8-12 times in 24 hours, skin to skin and breast feed before formula feeding.  If missed feedings at breast or substituting feeding with formula, advised to hand express and/or pump to remove milk from the breast.     Maternal Data Has patient been taught Hand Expression?: Yes Does the patient have breastfeeding experience prior to this delivery?: No  Feeding Mother's Current Feeding Choice: Breast Milk and Formula  LATCH Score Latch: Repeated attempts needed to sustain latch,  nipple held in mouth throughout feeding, stimulation needed to elicit sucking reflex.  Audible Swallowing: A few with stimulation  Type of Nipple: Everted at rest and after stimulation  Comfort (Breast/Nipple): Soft / non-tender  Hold (Positioning): Assistance needed to correctly position infant at breast and maintain latch.  LATCH Score: 7      Interventions Interventions: Breast feeding basics reviewed;Assisted with latch;Skin to skin;Breast compression;Adjust position;Support pillows;Education;LC Services brochure  Discharge Pump: DEBP (Spectra)  Consult Status Consult Status: Follow-up Date: 11/15/22 Follow-up type: In-patient    Christella Hartigan M 11/14/2022, 11:26 AM

## 2022-11-15 LAB — CBC
HCT: 24.4 % — ABNORMAL LOW (ref 36.0–46.0)
Hemoglobin: 7.6 g/dL — ABNORMAL LOW (ref 12.0–15.0)
MCH: 24 pg — ABNORMAL LOW (ref 26.0–34.0)
MCHC: 31.1 g/dL (ref 30.0–36.0)
MCV: 77 fL — ABNORMAL LOW (ref 80.0–100.0)
Platelets: 160 10*3/uL (ref 150–400)
RBC: 3.17 MIL/uL — ABNORMAL LOW (ref 3.87–5.11)
RDW: 15.7 % — ABNORMAL HIGH (ref 11.5–15.5)
WBC: 14.2 10*3/uL — ABNORMAL HIGH (ref 4.0–10.5)
nRBC: 0 % (ref 0.0–0.2)

## 2022-11-15 LAB — BIRTH TISSUE RECOVERY COLLECTION (PLACENTA DONATION)

## 2022-11-15 MED ORDER — SODIUM CHLORIDE 0.9 % IV SOLN
500.0000 mg | Freq: Once | INTRAVENOUS | Status: AC
  Administered 2022-11-15: 500 mg via INTRAVENOUS
  Filled 2022-11-15: qty 25

## 2022-11-15 MED ORDER — ALBUTEROL SULFATE (2.5 MG/3ML) 0.083% IN NEBU: 2.5000 mg | INHALATION_SOLUTION | Freq: Once | RESPIRATORY_TRACT | Status: DC | PRN

## 2022-11-15 MED ORDER — CYCLOBENZAPRINE HCL 5 MG PO TABS
7.5000 mg | ORAL_TABLET | Freq: Three times a day (TID) | ORAL | Status: DC | PRN
  Administered 2022-11-15: 7.5 mg via ORAL
  Filled 2022-11-15 (×2): qty 1.5

## 2022-11-15 MED ORDER — EPINEPHRINE PF 1 MG/ML IJ SOLN: 0.3000 mg | Freq: Once | INTRAMUSCULAR | Status: DC | PRN

## 2022-11-15 MED ORDER — METHYLPREDNISOLONE SODIUM SUCC 125 MG IJ SOLR: 125.0000 mg | Freq: Once | INTRAMUSCULAR | Status: DC | PRN

## 2022-11-15 MED ORDER — DIPHENHYDRAMINE HCL 50 MG/ML IJ SOLN: 25.0000 mg | Freq: Once | INTRAMUSCULAR | Status: DC | PRN

## 2022-11-15 MED ORDER — SODIUM CHLORIDE 0.9 % IV BOLUS: 500.0000 mL | Freq: Once | INTRAVENOUS | Status: DC | PRN

## 2022-11-15 MED ORDER — SODIUM CHLORIDE 0.9 % IV SOLN: INTRAVENOUS | Status: AC | PRN

## 2022-11-15 NOTE — Anesthesia Postprocedure Evaluation (Signed)
Anesthesia Post Note  Patient: April Conner  Procedure(s) Performed: AN AD HOC LABOR EPIDURAL     Patient location during evaluation: Mother Baby Anesthesia Type: Epidural Level of consciousness: awake and alert and oriented Pain management: satisfactory to patient Vital Signs Assessment: post-procedure vital signs reviewed and stable Respiratory status: respiratory function stable Cardiovascular status: stable Postop Assessment: no headache, no backache, epidural receding, patient able to bend at knees, no signs of nausea or vomiting, adequate PO intake and able to ambulate Anesthetic complications: no   No notable events documented.  Last Vitals:  Vitals:   11/14/22 2305 11/15/22 0600  BP: 109/66 112/66  Pulse: 89 66  Resp: 16 15  Temp: 37.2 C 36.8 C  SpO2: 100% 99%    Last Pain:  Vitals:   11/15/22 0600  TempSrc: Oral  PainSc: 0-No pain   Pain Goal:                   [name]

## 2022-11-15 NOTE — Lactation Note (Signed)
This note was copied from a baby's chart. Lactation Consultation Note  Patient Name: Girl Angelice Bua [id number] Date: 11/15/2022 Age:28 hours   P1- LC to room for consult. RN was in room and stated that MOB would like to sleep a little longer and for LC to come back later. LC will attempt to see MOB at a later time today.  Dema Severin BS, IBCLC 11/15/2022, 11:11 AM

## 2022-11-15 NOTE — Social Work (Addendum)
CSW received consult for hx of Depression.  CSW met with MOB to offer support and complete assessment.    CSW met with MOB at bedside and introduced CSW role. CSW observed MOB lying in the bed receiving IV iron and FOB holding the infant at bedside. MOB presented calm and was receptive to CSW visit. CSW congratulated MOB and FOB on baby girl, Conservator, museum/gallery. CSW offered privacy and MOB gave CSW permission to share information with FOB present. CSW inquired how MOB has been feeling since giving birth. MOB expressed that she has felt tired and in pain. (During the assessment the nurse offered MOB pain medication and MOB agreed to take the medication). CSW inquired about MOB mental health history. MOB acknowledged that she has anxiety, depression, and ADHD. MOB reported she treated her symptoms with Lexapro, Adderall, and Propranolol and stopped when she learned about the pregnancy. MOB stated during the pregnancy, "I suffered and just pushed through it." MOB reported she will resume the medication and feels comfortable reaching out to her provider for another prescription. MOB reported that she saw a therapist in the past but quit going because she did not have the time. She reported that she is open to seeing a therapist again for treatment if needed. CSW inquired about MOB coping strategies. MOB reported that she sleeps and tries not to procrastinate to cope with her anxiety. CSW acknowledged MOB efforts. CSW inquired about MOB supports. MOB identified FOB and her Laney Potash has supports.   CSW provided education regarding the baby blues period vs. perinatal mood disorders, discussed treatment and gave resources for mental health follow up if concerns arise.  CSW recommended MOB complete a self-evaluation during the postpartum time period using the New Mom Checklist from Postpartum Progress and encouraged MOB to contact a medical professional if symptoms are noted at any time. CSW assessed MOB for safety. MOB denied  thoughts of harm to self and others.   CSW provided review of Sudden Infant Death Syndrome (SIDS) precautions. MOB reported understanding. CSW inquired if MOB had items for the infant. MOB reported that she has all essential items to care for the infant.   CSW identifies no further need for intervention and no barriers to discharge at this time.  Vivi Barrack, MSW, LCSW Clinical Social Worker  [phone] 2022/05/03  2:51 PM

## 2022-11-15 NOTE — Progress Notes (Signed)
Post Partum Day 1 Subjective: Patient has a few concerns today, namely regarding infant feeding and secretions. She also reports back pain at the site of her epidural and perineal pain.  Feeling overall fatigued.  Objective: Blood pressure 112/66, pulse 66, temperature 98.3 F (36.8 C), temperature source Oral, resp. rate 15, height 5\' 1"  (1.549 m), weight 88 kg, SpO2 99%, unknown if currently breastfeeding.  Physical Exam:  General: alert and no distress Lochia: appropriate Uterine Fundus: firm Incision: Laceration healing well, mild induration on left labia.  Non-tender to palpation, no drainage or surrounding erythema Back: bruising surrounding epidural site, no erythema. Paraspinal muscles tender to palpation at same spinal level DVT Evaluation: No evidence of DVT seen on physical exam. No significant calf/ankle edema.  Recent Labs    11/13/22 2201 11/15/22 0550  HGB 9.5* 7.6*  HCT 29.8* 24.4*    Assessment/Plan: 27 G1P1 PPD#1 s/p NSVD with I&D for labial abscess - PPC: Continue routine care, discharge home tomorrow - Acute on chronic blood loss anemia: IV iron today  - GHTN: BP stable without medications   LOS: 2 days   Junius Creamer, DO 11/15/2022, 10:10 AM

## 2022-11-15 NOTE — Lactation Note (Signed)
This note was copied from a baby's chart. Lactation Consultation Note  Patient Name: April Conner [id number] Date: 11/15/2022 Age:28 hours Reason for consult: Follow-up assessment;Primapara;1st time breastfeeding;RN request;Early term 37-38.6wks;Infant < 6lbs;Infant weight loss  P1- RN requested for LC to see MOB and assist with latching. Infant was born at 37w 2d, weighing [phone] and had lost 4.93% in the first 24 hrs. Per MOB, infant has been sleepy on the breast, but does well with the bottle of formula. RN had already set MOB up with a DEBP and size 18 mm flanges.   Infant was alert and crying, so LC encouraged MOB to latch her. LC assisted MOB with placing infant on the right breast in the football hold. MOB's nipples initially look short shafted, but when MOB stimulates them they evert a lot and stay everted. Once MOB finished stimulating her nipples, she was able to latch infant immediately. Infant would not suck once on the breast, instead she would immediately fall asleep. After 10 minutes of stimulating infant into sucking, LC recommended stopping to not burn more calories than she is taking in.   LC then assisted MOB with pumping for 15 minutes. 2 mL EBM was collected within 15 minutes and bottle fed to infant. Infant tolerated feeding well, but needed stimulation to suck. FOB then formula fed infant and again she tolerated this feeding well.  LC encouraged MOB to attempt a latch  with every feeding, but stop after 10 minutes if infant does not nurse. Then MOB should pump with the DEBP for 15 minutes and offer EBM to infant. Depending on MOB's EBM volume, she should then supplement with formula. At this point, MOB is supplementing with every feeding due to her EBM volume.  LC encouraged Mob to reach out for further assistance as needed.  Maternal Data Has patient been taught Hand Expression?: Yes Does the patient have breastfeeding experience prior to this delivery?:  No  Feeding Mother's Current Feeding Choice: Breast Milk and Formula Nipple Type: Nfant Standard Flow (white)  LATCH Score Latch: Too sleepy or reluctant, no latch achieved, no sucking elicited.  Audible Swallowing: None  Type of Nipple: Everted at rest and after stimulation  Comfort (Breast/Nipple): Soft / non-tender  Hold (Positioning): Assistance needed to correctly position infant at breast and maintain latch.  LATCH Score: 5   Lactation Tools Discussed/Used Tools: Pump;Flanges;Coconut oil Flange Size: 18 Breast pump type: Double-Electric Breast Pump;Manual Pump Education: Setup, frequency, and cleaning;Milk Storage Reason for Pumping: LPI/low birth weight Pumping frequency: 15-20 min every 2-3 hrs Pumped volume: 2 mL  Interventions Interventions: Breast feeding basics reviewed;Assisted with latch;Breast compression;Adjust position;Support pillows;Position options;Expressed milk;Coconut oil;DEBP;Education;Pace feeding;LC Services brochure  Discharge Discharge Education: Warning signs for feeding baby Pump: DEBP;Personal  Consult Status Consult Status: Follow-up Date: 11/16/22 Follow-up type: In-patient    Dema Severin BS, IBCLC 11/15/2022, 1:58 PM

## 2022-11-16 MED ORDER — CYCLOBENZAPRINE HCL 5 MG PO TABS: 5.0000 mg | ORAL_TABLET | Freq: Three times a day (TID) | ORAL | 0 refills | Status: DC | PRN

## 2022-11-16 MED ORDER — IBUPROFEN 200 MG PO TABS: 600.0000 mg | ORAL_TABLET | Freq: Four times a day (QID) | ORAL | Status: DC | PRN

## 2022-11-16 MED ORDER — ACETAMINOPHEN 325 MG PO TABS: 650.0000 mg | ORAL_TABLET | Freq: Four times a day (QID) | ORAL | Status: AC | PRN

## 2022-11-16 NOTE — Discharge Summary (Signed)
Postpartum Discharge Summary  Date of Service updated 11/16/22      Patient Name: April Conner DOB: 30-Aug-1995 MRN: [id number]  Date of admission: 11/13/2022 Delivery date:11/14/2022 Delivering provider: Marlow Baars Date of discharge: 11/16/2022  Admitting diagnosis: Gestational hypertension, third trimester [O13.3] Intrauterine pregnancy: [redacted]w[redacted]d     Secondary diagnosis:  Principal Problem:   Gestational hypertension, third trimester  Additional problems: anxiety, depression, ADHD    Discharge diagnosis: Anemia                                              Post partum procedures: none Augmentation: Cytotec and IP Foley Complications: None  Hospital course: Induction of Labor With Vaginal Delivery   27 y.o. yo G1P1001 at [redacted]w[redacted]d was admitted to the hospital 11/13/2022 for induction of labor.  Indication for induction: Gestational hypertension.  Patient had an labor course complicated bynothing Membrane Rupture Time/Date: 11:10 PM,11/13/2022  Delivery Method:Vaginal, Spontaneous Operative Delivery:N/A Episiotomy: None Lacerations:  2nd degree Details of delivery can be found in separate delivery note.  Patient had a postpartum course complicated by anemia, received IV iron infusion. Patient is discharged home 11/16/22.  Newborn Data: Birth date:11/14/2022 Birth time:6:04 AM Gender:Female Living status:Living Apgars:9 ,9  Weight:2860 g  Magnesium Sulfate received: No BMZ received: No Rhophylac:N/A MMR:N/A T-DaP:Given prenatally Flu: Given prenatally RSV Vaccine received: Yes Transfusion:No Immunizations administered: Immunization History  Administered Date(s) Administered   PFIZER(Purple Top)SARS-COV-2 Vaccination 05/20/2019, 06/11/2019    Physical exam  Vitals:   11/15/22 1650 11/15/22 2050 11/15/22 2300 11/16/22 0551  BP: 129/88 (!) 148/79 124/65 124/84  Pulse: 78 75 77 82  Resp: 16 16 16 16   Temp: 98.2 F (36.8 C) 98.2 F (36.8 C)  98.7 F (37.1  C)  TempSrc: Oral Oral  Oral  SpO2: 100% 99% 97% 99%  Weight:      Height:       General: alert, cooperative, and no distress Lochia: appropriate Uterine Fundus: firm Incision: N/A DVT Evaluation: No evidence of DVT seen on physical exam. Labs: Lab Results  Component Value Date   WBC 14.2 (H) 11/15/2022   HGB 7.6 (L) 11/15/2022   HCT 24.4 (L) 11/15/2022   MCV 77.0 (L) 11/15/2022   PLT 160 11/15/2022      Latest Ref Rng & Units 11/13/2022    1:01 PM  CMP  Glucose 70 - 99 mg/dL 92   BUN 6 - 20 mg/dL 7   Creatinine 4.69 - 6.29 mg/dL 5.28   Sodium 413 - 244 mmol/L 135   Potassium 3.5 - 5.1 mmol/L 3.4   Chloride 98 - 111 mmol/L 105   CO2 22 - 32 mmol/L 19   Calcium 8.9 - 10.3 mg/dL 8.9   Total Protein 6.5 - 8.1 g/dL 6.2   Total Bilirubin <0.1 mg/dL <0.2   Alkaline Phos 38 - 126 U/L 157   AST 15 - 41 U/L 17   ALT 0 - 44 U/L 13    Edinburgh Score:    11/14/2022    5:48 PM  Edinburgh Postnatal Depression Scale Screening Tool  I have been able to laugh and see the funny side of things. 0  I have looked forward with enjoyment to things. 2  I have blamed myself unnecessarily when things went wrong. 2  I have been anxious or worried for no good reason. 3  I have felt scared or panicky for no good reason. 2  Things have been getting on top of me. 2  I have been so unhappy that I have had difficulty sleeping. 1  I have felt sad or miserable. 1  I have been so unhappy that I have been crying. 1  The thought of harming myself has occurred to me. 0  Edinburgh Postnatal Depression Scale Total 14      After visit meds:  Allergies as of 11/16/2022   No Known Allergies      Medication List     STOP taking these medications    cetirizine 10 MG tablet Commonly known as: ZYRTEC   ondansetron 4 MG tablet Commonly known as: ZOFRAN       TAKE these medications    acetaminophen 325 MG tablet Commonly known as: Tylenol Take 2 tablets (650 mg total) by mouth every  6 (six) hours as needed (for pain scale < 4).   cyclobenzaprine 5 MG tablet Commonly known as: FLEXERIL Take 1 tablet (5 mg total) by mouth 3 (three) times daily as needed for muscle spasms.   ferrous sulfate 325 (65 FE) MG tablet Take 325 mg by mouth daily with breakfast.   ibuprofen 200 MG tablet Commonly known as: ADVIL Take 3 tablets (600 mg total) by mouth every 6 (six) hours as needed.   magnesium oxide 400 MG tablet Commonly known as: MAG-OX Take 400 mg by mouth daily.   melatonin 3 MG Tabs tablet Take by mouth.   multivitamin-prenatal 27-0.8 MG Tabs tablet Take 1 tablet by mouth daily at 12 noon.   riboflavin 100 MG Tabs tablet Commonly known as: VITAMIN B-2 Take 100 mg by mouth daily.         Discharge home in stable condition Infant Feeding: Breast Infant Disposition:home with mother Discharge instruction: per After Visit Summary and Postpartum booklet. Activity: Advance as tolerated. Pelvic rest for 6 weeks.  Diet: routine diet Anticipated Birth Control: Unsure Postpartum Appointment:4 weeks Additional Postpartum F/U: BP check 1 week Future Appointments:No future appointments. Follow up Visit:  Follow-up Information     Ob/Gyn, Nestor Ramp. Schedule an appointment as soon as possible for a visit in 1 week(s).   Why: For blood pressure check and for laceration check Contact information: [address] [place] [address] [phone]                     11/16/2022 [name], MD

## 2022-11-16 NOTE — Progress Notes (Addendum)
Post Partum Day 2 Subjective: Patient is doing well this morning. Reports some low back and vaginal pain, which is well controlled. Flexeril helped with back pain overnight. Ambulating, voiding, tolerating PO. Minimal lochia. Breastfeeding.   Objective: Patient Vitals for the past 24 hrs:  BP Temp Temp src Pulse Resp SpO2  11/16/22 0551 124/84 98.7 F (37.1 C) Oral 82 16 99 %  11/15/22 2300 124/65 -- -- 77 16 97 %  11/15/22 2050 (!) 148/79 98.2 F (36.8 C) Oral 75 16 99 %  11/15/22 1650 129/88 98.2 F (36.8 C) Oral 78 16 100 %  11/15/22 1550 133/85 99 F (37.2 C) Oral 79 16 98 %  11/15/22 1100 (!) 126/90 98.6 F (37 C) Oral 71 15 97 %    Physical Exam:  General: alert, cooperative, and no distress Lochia: appropriate Uterine Fundus: firm Incision: Laceration healing well, mild induration on left labia.  Non-tender to palpation, no drainage or surrounding erythema DVT Evaluation: No evidence of DVT seen on physical exam.  Recent Labs    11/13/22 1301 11/13/22 2201 11/15/22 0550  WBC 12.4* 13.5* 14.2*  HGB 9.7* 9.5* 7.6*  HCT 31.2* 29.8* 24.4*  PLT 239 211 160    Recent Labs    11/13/22 1301  NA 135  K 3.4*  CL 105  BUN 7  CREATININE 0.52  GLUCOSE 92  BILITOT <0.2  ALT 13  AST 17  ALKPHOS 157*  PROT 6.2*  ALBUMIN 2.6*    Recent Labs    11/13/22 1301  CALCIUM 8.9    No results for input(s): "PROTIME", "APTT", "INR" in the last 72 hours.  No results for input(s): "PROTIME", "APTT", "INR", "FIBRINOGEN" in the last 72 hours. Assessment/Plan: April Conner 27 y.o. G1P1001 PPD#2 sp SVD with I&D for labial abscess 1. PPC: routine PP care 2. GHTN: mostly normotensive without antihypertensives 3. Acute on chronic blood loss anemia: s/p IV iron yesterday 4. Mood disorder: plans to restart home psych meds 5. Dispo: anticipate discharge home today with BP and labial incision check next week   LOS: 3 days   [name] 11/16/2022, 9:50 AM

## 2022-11-16 NOTE — Lactation Note (Signed)
This note was copied from a baby's chart. Lactation Consultation Note  Patient Name: April Conner [id number] Date: 11/16/2022 Age:28 hours Reason for consult: Follow-up assessment;1st time breastfeeding;Early term 37-38.6wks  P1, Baby [redacted]w[redacted]d.  7.17% weight loss.  Baby is formula feeding with Nfant white standard nipple with increased volume of 25-30 ml.  Mother states she was in pain last night and did not get to pump.  Parents aware to increase feeding volume per day of life and as baby desires.  She is happy to report she pumped 4 ml recently.  When LC was in room father of baby attempted bottle feeding breastmilk but after recent 1300 feeding, baby did not show interest. Encouraged mother to pumped 8 times per day to maximize stimulation to her milk supply for 15 min both breasts. Reviewed engorgement care and monitoring voids/stools. RN to give family Lactation support information at [facility] and pump cleaning information.    Maternal Data Has patient been taught Hand Expression?: Yes  Feeding Mother's Current Feeding Choice: Breast Milk and Formula Nipple Type: Nfant Extra Slow Flow (gold)  Lactation Tools Discussed/Used Tools: Pump;Flanges Flange Size: 21 Breast pump type: Double-Electric Breast Pump Reason for Pumping: stimulation and supplementation Pumping frequency: q 3 hours Pumped volume: 4 mL  Interventions Interventions: Education  Discharge Discharge Education: Engorgement and breast care;Warning signs for feeding baby Pump: Personal;DEBP (Spectra)  Consult Status Consult Status: Complete Date: 11/16/22    Dahlia Byes Boschen  RN IBCLC 11/16/2022, 3:06 PM

## 2022-11-17 ENCOUNTER — Encounter (HOSPITAL_COMMUNITY): Payer: Self-pay | Admitting: Obstetrics and Gynecology

## 2022-11-17 ENCOUNTER — Inpatient Hospital Stay (HOSPITAL_COMMUNITY)
Admission: AD | Admit: 2022-11-17 | Discharge: 2022-11-17 | Disposition: A | Discharge status: Home / Self Care | Payer: No Typology Code available for payment source | Attending: Obstetrics & Gynecology | Admitting: Obstetrics & Gynecology

## 2022-11-17 DIAGNOSIS — K6289 Other specified diseases of anus and rectum: Secondary | ICD-10-CM | POA: Diagnosis present

## 2022-11-17 DIAGNOSIS — K640 First degree hemorrhoids: Secondary | ICD-10-CM

## 2022-11-17 DIAGNOSIS — O135 Gestational [pregnancy-induced] hypertension without significant proteinuria, complicating the puerperium: Secondary | ICD-10-CM | POA: Insufficient documentation

## 2022-11-17 DIAGNOSIS — O9089 Other complications of the puerperium, not elsewhere classified: Secondary | ICD-10-CM

## 2022-11-17 DIAGNOSIS — R252 Cramp and spasm: Secondary | ICD-10-CM | POA: Diagnosis not present

## 2022-11-17 DIAGNOSIS — O872 Hemorrhoids in the puerperium: Secondary | ICD-10-CM | POA: Insufficient documentation

## 2022-11-17 MED ORDER — HYDROCORT-PRAMOXINE (PERIANAL) 1-1 % EX FOAM
1.0000 | Freq: Once | CUTANEOUS | Status: AC
  Administered 2022-11-17: 1 via RECTAL
  Filled 2022-11-17: qty 10

## 2022-11-17 MED ORDER — DOCUSATE SODIUM 100 MG PO CAPS
200.0000 mg | ORAL_CAPSULE | Freq: Once | ORAL | Status: AC
  Administered 2022-11-17: 200 mg via ORAL
  Filled 2022-11-17: qty 2

## 2022-11-17 MED ORDER — DOCUSATE SODIUM 250 MG PO CAPS: 250.0000 mg | ORAL_CAPSULE | Freq: Every day | ORAL | 0 refills | Status: DC

## 2022-11-17 MED ORDER — IBUPROFEN 600 MG PO TABS
600.0000 mg | ORAL_TABLET | Freq: Once | ORAL | Status: AC
  Administered 2022-11-17: 600 mg via ORAL
  Filled 2022-11-17: qty 1

## 2022-11-17 MED ORDER — PROCTOFOAM HC 1-1 % EX FOAM: 1.0000 | Freq: Two times a day (BID) | CUTANEOUS | 2 refills | Status: DC | PRN

## 2022-11-17 NOTE — MAU Provider Note (Signed)
Chief Complaint:  Vaginal Pain and Rectal Pain   Event Date/Time   First Provider Initiated Contact with Patient 11/17/22 0138       HPI: April Conner is a 27 y.o. G1P1001 who is 3 days postpartum presents to maternity admissions reporting pain with hemorrhoid.  States was not given any medicine for this.  Was given Rx for ibuprofen postpartum but did not go to pharmacy to pick them up.  . She reports vaginal bleeding, but no urinary symptoms, h/a, n/v, or fever/chills.   Has known gestational hypertension, followup is planned this week  Other Chronicity: hemorrhoid pain. The problem occurs constantly. The problem has been unchanged. Associated symptoms include abdominal pain (some uterine cramping). Pertinent negatives include no fever, myalgias or nausea. Nothing aggravates the symptoms. She has tried nothing for the symptoms.   RN Note: .April Conner is a 27 y.o. here in MAU reporting what she thinks is hemorrhoidal pain. She had svd 11/12 and went home THurs am. Did have second degree tear with stitches. Has not taken anything for pain since home from hospital.  Onset of complaint: 2230  Past Medical History: Past Medical History:  Diagnosis Date   ADHD    Depression     Past obstetric history: OB History  Gravida Para Term Preterm AB Living  1 1 1     1   SAB IAB Ectopic Multiple Live Births        0 1    # Outcome Date GA Lbr Len/2nd Weight Sex Type Anes PTL Lv  1 Term 11/14/22 [redacted]w[redacted]d / 00:26 2860 g F Vag-Spont EPI  LIV     Birth Comments: WDL    Past Surgical History: Past Surgical History:  Procedure Laterality Date   WISDOM TOOTH EXTRACTION  2012    Family History: Family History  Problem Relation Age of Onset   Lung cancer Maternal Grandmother    Heart Problems Maternal Grandfather     Social History: Social History   Tobacco Use   Smoking status: Never   Smokeless tobacco: Never  Vaping Use   Vaping status: Never Used  Substance Use Topics    Alcohol use: No   Drug use: No    Allergies: No Known Allergies  Meds:  Medications Prior to Admission  Medication Sig Dispense Refill Last Dose   acetaminophen (TYLENOL) 325 MG tablet Take 2 tablets (650 mg total) by mouth every 6 (six) hours as needed (for pain scale < 4).   11/16/2022   cyclobenzaprine (FLEXERIL) 5 MG tablet Take 1 tablet (5 mg total) by mouth 3 (three) times daily as needed for muscle spasms. 10 tablet 0 Past Week   ferrous sulfate 325 (65 FE) MG tablet Take 325 mg by mouth daily with breakfast.   Past Week   ibuprofen (ADVIL) 200 MG tablet Take 3 tablets (600 mg total) by mouth every 6 (six) hours as needed.   11/16/2022   magnesium oxide (MAG-OX) 400 MG tablet Take 400 mg by mouth daily.   Past Week   Melatonin 3 MG TABS Take by mouth.      Prenatal Vit-Fe Fumarate-FA (MULTIVITAMIN-PRENATAL) 27-0.8 MG TABS tablet Take 1 tablet by mouth daily at 12 noon.      riboflavin (VITAMIN B-2) 100 MG TABS tablet Take 100 mg by mouth daily.       I have reviewed patient's Past Medical Hx, Surgical Hx, Family Hx, Social Hx, medications and allergies.  ROS:  Review of Systems  Constitutional:  Negative for fever.  Gastrointestinal:  Positive for abdominal pain (some uterine cramping). Negative for nausea.  Musculoskeletal:  Negative for myalgias.   Other systems negative     Physical Exam  Patient Vitals for the past 24 hrs:  BP Temp Pulse Resp SpO2 Height Weight  11/17/22 0124 (!) 132/92 -- -- -- -- -- --  11/17/22 0122 -- 98 F (36.7 C) 88 17 99 % 5\' 1"  (1.549 m) 83.9 kg   Constitutional: Well-developed, well-nourished female in no acute distress.  Cardiovascular: normal rate  Respiratory: normal effort, no distress.  GI: Abd soft, non-tender.  Nondistended.  No rebound, No guarding.  Small light pink hemorrhoid noted, no thrombosis or distention MS: Extremities nontender, no edema, normal ROM Neurologic: Alert and oriented x 4.   Grossly nonfocal. GU: Neg  CVAT. Skin:  Warm and Dry Psych:  Affect appropriate.   Labs: No results found for this or any previous visit (from the past 24 hour(s)). --/--/A POS (11/11 1255)  Imaging:  No results found.  MAU Course/MDM: I have reviewed the triage vital signs and the nursing notes.   Pertinent labs & imaging results that were available during my care of the patient were reviewed by me and considered in my medical decision making (see chart for details).      I have reviewed her medical records including past results, notes and treatments.   I have ordered labs as follows:none Imaging ordered: none    Treatments in MAU included Proctofoam HC applied to hemorrhoid..  Ibuprofen given for cramping and Colace given  Encouraged her to pick up prescriptions today  Pt stable at time of discharge.  Assessment: Postpartum Day #3 Hemorrhoid with pain Pelvic cramping postpartum  Plan: Discharge home Recommend Fill Rx today Rx sent for Proctofoam [facility] for hemorrhoid Colace daily until healed Followup this week in office for BP check  Encouraged to return here or to other Urgent Care/ED if she develops worsening of symptoms, increase in pain, fever, or other concerning symptoms.   Wynelle Bourgeois CNM, MSN Certified Nurse-Midwife 11/17/2022 1:38 AM

## 2022-11-17 NOTE — MAU Note (Signed)
.  April Conner is a 27 y.o. here in MAU reporting what she thinks is hemorrhoidal pain. She had svd 11/12 and went home THurs am. Did have second degree tear with stitches. Has not taken anything for pain since home from hospital.   Onset of complaint: 2230 Pain score: 9 Vitals:   11/17/22 0122 11/17/22 0124  BP:  (!) 132/92  Pulse: 88   Resp: 17   Temp: 98 F (36.7 C)   SpO2: 99%      FHT:n/a Lab orders placed from triage:  .none

## 2022-11-27 ENCOUNTER — Telehealth (HOSPITAL_COMMUNITY): Payer: Self-pay

## 2022-11-27 NOTE — Telephone Encounter (Signed)
11/27/2022 1225  Name: April Conner MRN: [id number] DOB: 1995/03/23  Reason for Call:  Transition of Care Hospital Discharge Call  Contact Status: Patient Contact Status: Message  Language assistant needed:          Follow-Up Questions:    Inocente Salles Postnatal Depression Scale:  In the Past 7 Days:    PHQ2-9 Depression Scale:     Discharge Follow-up:    Post-discharge interventions: NA  Signature  Signe Colt

## 2022-12-07 ENCOUNTER — Other Ambulatory Visit: Payer: Self-pay

## 2022-12-07 ENCOUNTER — Encounter (HOSPITAL_BASED_OUTPATIENT_CLINIC_OR_DEPARTMENT_OTHER): Payer: Self-pay | Admitting: Obstetrics and Gynecology

## 2022-12-07 NOTE — Progress Notes (Addendum)
Spoke w/ via phone for pre-op interview---April Conner needs dos----   UPT, ISTAT  (hx of anemia s/p recent iron infusion)    Conner results------11/13/22 cbc, cmp in Epic, 02/01/22 chest xray in [facility] COVID test -----patient states asymptomatic no test needed Arrive at -------1345 on Wednesday, 12/13/2022 NPO after MN NO Solid Food.  Clear liquids from MN until---1245 Med rec completed Medications to take morning of surgery -----none Diabetic medication -----n/a Patient instructed no nail polish to be worn day of surgery Patient instructed to bring photo id and insurance card day of surgery Patient aware to have Driver (ride ) / caregiver    for 24 hours after surgery - husband, April Conner Patient Special Instructions -----Please remove ear and nose piercing prior to surgery. Pre-Op special Instructions -----Lorain Childes - patient is breastfeeding. Requested orders from Dr. Timothy Lasso via Epic IB on 12/07/22. Patient verbalized understanding of instructions that were given at this phone interview. Patient denies chest pain, sob, fever, cough at the interview.
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
## 2022-12-13 ENCOUNTER — Encounter (HOSPITAL_BASED_OUTPATIENT_CLINIC_OR_DEPARTMENT_OTHER)
Admission: RE | Disposition: A | Discharge status: Home / Self Care | Payer: Self-pay | Source: Ambulatory Visit | Attending: Obstetrics and Gynecology

## 2022-12-13 ENCOUNTER — Ambulatory Visit (HOSPITAL_BASED_OUTPATIENT_CLINIC_OR_DEPARTMENT_OTHER)
Admission: RE | Admit: 2022-12-13 | Discharge: 2022-12-13 | Disposition: A | Discharge status: Home / Self Care | Payer: BC Managed Care – PPO | Source: Ambulatory Visit | Attending: Obstetrics and Gynecology | Admitting: Obstetrics and Gynecology

## 2022-12-13 ENCOUNTER — Encounter (HOSPITAL_BASED_OUTPATIENT_CLINIC_OR_DEPARTMENT_OTHER): Payer: Self-pay | Admitting: Obstetrics and Gynecology

## 2022-12-13 ENCOUNTER — Other Ambulatory Visit: Payer: Self-pay

## 2022-12-13 ENCOUNTER — Ambulatory Visit (HOSPITAL_BASED_OUTPATIENT_CLINIC_OR_DEPARTMENT_OTHER): Payer: BC Managed Care – PPO | Admitting: Anesthesiology

## 2022-12-13 DIAGNOSIS — Z01818 Encounter for other preprocedural examination: Secondary | ICD-10-CM

## 2022-12-13 DIAGNOSIS — N750 Cyst of Bartholin's gland: Secondary | ICD-10-CM | POA: Diagnosis not present

## 2022-12-13 DIAGNOSIS — N907 Vulvar cyst: Secondary | ICD-10-CM | POA: Diagnosis present

## 2022-12-13 HISTORY — DX: COVID-19: U07.1

## 2022-12-13 HISTORY — DX: Anemia, unspecified: D64.9

## 2022-12-13 HISTORY — DX: Presence of spectacles and contact lenses: Z97.3

## 2022-12-13 HISTORY — DX: Other pruritus: L29.89

## 2022-12-13 HISTORY — PX: BARTHOLIN CYST MARSUPIALIZATION: SHX5383

## 2022-12-13 HISTORY — DX: Headache, unspecified: R51.9

## 2022-12-13 HISTORY — DX: Personal history of traumatic brain injury: Z87.820

## 2022-12-13 HISTORY — DX: Unspecified asthma, uncomplicated: J45.909

## 2022-12-13 LAB — POCT I-STAT, CHEM 8
BUN: 13 mg/dL (ref 6–20)
Calcium, Ion: 1.19 mmol/L (ref 1.15–1.40)
Chloride: 104 mmol/L (ref 98–111)
Creatinine, Ser: 0.6 mg/dL (ref 0.44–1.00)
Glucose, Bld: 99 mg/dL (ref 70–99)
HCT: 40 % (ref 36.0–46.0)
Hemoglobin: 13.6 g/dL (ref 12.0–15.0)
Potassium: 4.1 mmol/L (ref 3.5–5.1)
Sodium: 145 mmol/L (ref 135–145)
TCO2: 16 mmol/L — ABNORMAL LOW (ref 22–32)

## 2022-12-13 LAB — POCT PREGNANCY, URINE: Preg Test, Ur: NEGATIVE

## 2022-12-13 SURGERY — MARSUPIALIZATION, CYST, BARTHOLIN'S GLAND
Anesthesia: General | Site: Vagina | Laterality: Left

## 2022-12-13 MED ORDER — LIDOCAINE HCL (PF) 2 % IJ SOLN
INTRAMUSCULAR | Status: AC
  Filled 2022-12-13: qty 5

## 2022-12-13 MED ORDER — FENTANYL CITRATE (PF) 100 MCG/2ML IJ SOLN
INTRAMUSCULAR | Status: DC | PRN
  Administered 2022-12-13 (×2): 25 ug via INTRAVENOUS
  Administered 2022-12-13: 50 ug via INTRAVENOUS

## 2022-12-13 MED ORDER — OXYCODONE HCL 5 MG PO TABS
ORAL_TABLET | ORAL | Status: AC
  Filled 2022-12-13: qty 1

## 2022-12-13 MED ORDER — OXYCODONE HCL 5 MG PO TABS
5.0000 mg | ORAL_TABLET | Freq: Once | ORAL | Status: AC | PRN
  Administered 2022-12-13: 5 mg via ORAL

## 2022-12-13 MED ORDER — GABAPENTIN 300 MG PO CAPS
ORAL_CAPSULE | ORAL | Status: AC
  Filled 2022-12-13: qty 1

## 2022-12-13 MED ORDER — DEXAMETHASONE SODIUM PHOSPHATE 10 MG/ML IJ SOLN
INTRAMUSCULAR | Status: AC
Start: 2022-12-13 — End: ?
  Filled 2022-12-13: qty 1

## 2022-12-13 MED ORDER — FENTANYL CITRATE (PF) 100 MCG/2ML IJ SOLN
25.0000 ug | INTRAMUSCULAR | Status: DC | PRN
Start: 2022-12-13 — End: 2022-12-13

## 2022-12-13 MED ORDER — MIDAZOLAM HCL 2 MG/2ML IJ SOLN
INTRAMUSCULAR | Status: DC | PRN
  Administered 2022-12-13: 2 mg via INTRAVENOUS

## 2022-12-13 MED ORDER — 0.9 % SODIUM CHLORIDE (POUR BTL) OPTIME
TOPICAL | Status: DC | PRN
Start: 2022-12-13 — End: 2022-12-13
  Administered 2022-12-13: 500 mL

## 2022-12-13 MED ORDER — LIDOCAINE-EPINEPHRINE 1 %-1:100000 IJ SOLN
INTRAMUSCULAR | Status: DC | PRN
Start: 2022-12-13 — End: 2022-12-13
  Administered 2022-12-13: 10 mL

## 2022-12-13 MED ORDER — SODIUM CHLORIDE 0.9 % IV SOLN: INTRAVENOUS | Status: DC

## 2022-12-13 MED ORDER — ONDANSETRON HCL 4 MG/2ML IJ SOLN
INTRAMUSCULAR | Status: AC
Start: 2022-12-13 — End: ?
  Filled 2022-12-13: qty 2

## 2022-12-13 MED ORDER — MEPERIDINE HCL 25 MG/ML IJ SOLN
6.2500 mg | INTRAMUSCULAR | Status: DC | PRN
Start: 2022-12-13 — End: 2022-12-13

## 2022-12-13 MED ORDER — ONDANSETRON HCL 4 MG/2ML IJ SOLN
INTRAMUSCULAR | Status: DC | PRN
  Administered 2022-12-13: 4 mg via INTRAVENOUS

## 2022-12-13 MED ORDER — PROPOFOL 10 MG/ML IV BOLUS
INTRAVENOUS | Status: DC | PRN
  Administered 2022-12-13: 200 mg via INTRAVENOUS

## 2022-12-13 MED ORDER — DROPERIDOL 2.5 MG/ML IJ SOLN: 0.6250 mg | Freq: Once | INTRAMUSCULAR | Status: DC | PRN

## 2022-12-13 MED ORDER — GABAPENTIN 300 MG PO CAPS
300.0000 mg | ORAL_CAPSULE | Freq: Once | ORAL | Status: AC
  Administered 2022-12-13: 300 mg via ORAL

## 2022-12-13 MED ORDER — DEXAMETHASONE SODIUM PHOSPHATE 10 MG/ML IJ SOLN
INTRAMUSCULAR | Status: DC | PRN
  Administered 2022-12-13: 5 mg via INTRAVENOUS

## 2022-12-13 MED ORDER — MIDAZOLAM HCL 2 MG/2ML IJ SOLN
INTRAMUSCULAR | Status: AC
  Filled 2022-12-13: qty 2

## 2022-12-13 MED ORDER — ACETAMINOPHEN 500 MG PO TABS: 1000.0000 mg | ORAL_TABLET | ORAL | Status: DC

## 2022-12-13 MED ORDER — FENTANYL CITRATE (PF) 100 MCG/2ML IJ SOLN
INTRAMUSCULAR | Status: AC
  Filled 2022-12-13: qty 2

## 2022-12-13 MED ORDER — ACETAMINOPHEN 500 MG PO TABS
ORAL_TABLET | ORAL | Status: AC
Start: 2022-12-13 — End: ?
  Filled 2022-12-13: qty 2

## 2022-12-13 MED ORDER — STERILE WATER FOR IRRIGATION IR SOLN
Status: DC | PRN
  Administered 2022-12-13: 500 mL

## 2022-12-13 MED ORDER — DEXMEDETOMIDINE HCL IN NACL 80 MCG/20ML IV SOLN
INTRAVENOUS | Status: DC | PRN
  Administered 2022-12-13 (×2): 8 ug via INTRAVENOUS

## 2022-12-13 MED ORDER — OXYCODONE HCL 5 MG/5ML PO SOLN: 5.0000 mg | Freq: Once | ORAL | Status: AC | PRN

## 2022-12-13 MED ORDER — LACTATED RINGERS IV SOLN: INTRAVENOUS | Status: DC

## 2022-12-13 MED ORDER — ACETAMINOPHEN 500 MG PO TABS
1000.0000 mg | ORAL_TABLET | Freq: Once | ORAL | Status: AC
Start: 2022-12-13 — End: 2022-12-13
  Administered 2022-12-13: 1000 mg via ORAL

## 2022-12-13 MED ORDER — LIDOCAINE 2% (20 MG/ML) 5 ML SYRINGE
INTRAMUSCULAR | Status: DC | PRN
  Administered 2022-12-13: 40 mg via INTRAVENOUS

## 2022-12-13 MED ORDER — WHITE PETROLATUM EX OINT
TOPICAL_OINTMENT | CUTANEOUS | Status: AC
Start: 2022-12-13 — End: ?
  Filled 2022-12-13: qty 5

## 2022-12-13 SURGICAL SUPPLY — 24 items
BLADE SURG 15 STRL LF DISP TIS (BLADE) IMPLANT
CATH BARTHOLIN GLAND 10FR 5CM (CATHETERS) IMPLANT
CATH ROBINSON RED A/P 14FR (CATHETERS) ×2 IMPLANT
GAUZE 4X4 16PLY ~~LOC~~+RFID DBL (SPONGE) ×2 IMPLANT
GLOVE BIO SURGEON STRL SZ 6.5 (GLOVE) ×2 IMPLANT
GLOVE BIOGEL PI IND STRL 6.5 (GLOVE) ×2 IMPLANT
GLOVE BIOGEL PI IND STRL 7.0 (GLOVE) ×2 IMPLANT
GOWN STRL REUS W/TWL LRG LVL3 (GOWN DISPOSABLE) ×2 IMPLANT
KIT TURNOVER CYSTO (KITS) ×2 IMPLANT
LEGGING LITHOTOMY PAIR STRL (DRAPES) ×2 IMPLANT
NDL HYPO 22X1.5 SAFETY MO (MISCELLANEOUS) IMPLANT
NEEDLE HYPO 22X1.5 SAFETY MO (MISCELLANEOUS) ×1 IMPLANT
NS IRRIG 500ML POUR BTL (IV SOLUTION) IMPLANT
PACK PERINEAL COLD (PAD) IMPLANT
PACK VAGINAL WOMENS (CUSTOM PROCEDURE TRAY) ×2 IMPLANT
PAD OB MATERNITY 4.3X12.25 (PERSONAL CARE ITEMS) ×2 IMPLANT
SLEEVE SCD COMPRESS KNEE MED (STOCKING) ×2 IMPLANT
SPIKE FLUID TRANSFER (MISCELLANEOUS) IMPLANT
SUT VIC AB 4-0 PS2 27 (SUTURE) IMPLANT
SUT VICRYL 3 0 UR 6 27 (SUTURE) IMPLANT
TOWEL OR 17X24 6PK STRL BLUE (TOWEL DISPOSABLE) ×2 IMPLANT
WATER STERILE IRR 1000ML POUR (IV SOLUTION) ×2 IMPLANT
WATER STERILE IRR 500ML POUR (IV SOLUTION) IMPLANT
Word Bartholin Gland Catheter IMPLANT

## 2022-12-13 NOTE — Interval H&P Note (Signed)
History and Physical Interval Note:  12/13/2022 2:01 PM  April Conner  has presented today for surgery, with the diagnosis of vulvar cyst.  The various methods of treatment have been discussed with the patient and family. After consideration of risks, benefits and other options for treatment, the patient has consented to  Procedure(s): BARTHOLIN CYST MARSUPIALIZATION (Left) as a surgical intervention.  The patient's history has been reviewed, patient examined, no change in status, stable for surgery.  I have reviewed the patient's chart and labs.  Questions were answered to the patient's satisfaction.     [name]

## 2022-12-13 NOTE — Anesthesia Procedure Notes (Signed)
Procedure Name: LMA Insertion Date/Time: 12/13/2022 2:29 PM  Performed by: Francie Massing, CRNAPre-anesthesia Checklist: Patient identified, Emergency Drugs available, Suction available and Patient being monitored Patient Re-evaluated:Patient Re-evaluated prior to induction Oxygen Delivery Method: Circle system utilized Preoxygenation: Pre-oxygenation with 100% oxygen Induction Type: IV induction Ventilation: Mask ventilation without difficulty LMA: LMA inserted LMA Size: 4.0 Number of attempts: 1 Airway Equipment and Method: Bite block Placement Confirmation: positive ETCO2 Tube secured with: Tape Dental Injury: Teeth and Oropharynx as per pre-operative assessment

## 2022-12-13 NOTE — Discharge Instructions (Signed)
°  Post Anesthesia Home Care Instructions ° °Activity: °Get plenty of rest for the remainder of the day. A responsible individual must stay with you for 24 hours following the procedure.  °For the next 24 hours, DO NOT: °-Drive a car °-Operate machinery °-Drink alcoholic beverages °-Take any medication unless instructed by your physician °-Make any legal decisions or sign important papers. ° °Meals: °Start with liquid foods such as gelatin or soup. Progress to regular foods as tolerated. Avoid greasy, spicy, heavy foods. If nausea and/or vomiting occur, drink only clear liquids until the nausea and/or vomiting subsides. Call your physician if vomiting continues. ° °Special Instructions/Symptoms: °Your throat may feel dry or sore from the anesthesia or the breathing tube placed in your throat during surgery. If this causes discomfort, gargle with warm salt water. The discomfort should disappear within 24 hours. ° °   °Call your surgeon if you experience:  ° °1.  Fever over 101.0. °2.  Inability to urinate. °3.  Nausea and/or vomiting. °4.  Extreme swelling or bruising at the surgical site. °5.  Continued bleeding from the incision. °6.  Increased pain, redness or drainage from the incision. °7.  Problems related to your pain medication. °8.  Any problems and/or concerns °

## 2022-12-13 NOTE — Op Note (Signed)
12/13/2022   3:11 PM   PATIENT:  April Conner  27 y.o. female   PRE-OPERATIVE DIAGNOSIS:  vulvar cyst   POST-OPERATIVE DIAGNOSIS:  Left Bartholin's gland cyst   PROCEDURE:  Procedure(s): BARTHOLIN CYST MARSUPIALIZATION (Left)   SURGEON:  Surgeons and Role:    * [name], MD - Primary   ASSISTANTS: Eliberto Ivory, MD    ANESTHESIA:   local and general   EBL:  15 mL    BLOOD ADMINISTERED:none   DRAINS: none    LOCAL MEDICATIONS USED:  LIDOCAINE  and Amount: 10 ml   SPECIMEN:  No Specimen   DISPOSITION OF SPECIMEN:  N/A   COUNTS:  YES  COMPLICATIONS: none  FINDINGS: 4cm left Bartholin's gland cyst drained of 10ml of blood clot  PROCEDURE IN DETAIL:  The patient was appropriately consented and taken to the operating room, where general anesthesia was obtained without difficulty. She was placed in dorsal lithotomy position with legs in stirrups. She was prepped and draped in normal sterile fashion.   An 2cm incision was made over the cyst in the vaginal mucosa, exterior to the hymenal ring. The cyst wall was punctured and the cyst was expressed of approximately 10mL of blood clot. A word catheter was placed and inflated to assist in retraction and visualization. The cyst walls were identified, everted, and sutured onto the edge of the vestibular mucosa with 3-0 running locked suture. The cyst cavity was irrigated with saline solution. Lidocaine 1% with epinephrine was infiltrated into the incision. All instruments were removed.   The patient was awakened from anesthesia and taken to the recovery room in stable condition.   Derl Barrow MD 12/13/22 3:18 PM

## 2022-12-13 NOTE — Transfer of Care (Signed)
Immediate Anesthesia Transfer of Care Note  Patient: April Conner  Procedure(s) Performed: Procedure(s) (LRB): BARTHOLIN CYST MARSUPIALIZATION (Left)  Patient Location: PACU  Anesthesia Type: General  Level of Consciousness: awake, oriented, sedated and patient cooperative  Airway & Oxygen Therapy: Patient Spontanous Breathing and Patient connected to face mask oxygen  Post-op Assessment: Report given to PACU RN and Post -op Vital signs reviewed and stable  Post vital signs: Reviewed and stable  Complications: No apparent anesthesia complications  Last Vitals:  Vitals Value Taken Time  BP    Temp    Pulse 88 12/13/22 1516  Resp    SpO2 100 % 12/13/22 1516  Vitals shown include unfiled device data.  Last Pain:  Vitals:   12/13/22 1405  TempSrc: Oral  PainSc: 0-No pain      Patients Stated Pain Goal: 6 (12/13/22 1405)  Complications: No notable events documented.

## 2022-12-13 NOTE — Anesthesia Preprocedure Evaluation (Addendum)
Anesthesia Evaluation  Patient identified by MRN, date of birth, ID band Patient awake    Reviewed: Allergy & Precautions, NPO status , Patient's Chart, lab work & pertinent test results  Airway Mallampati: III  TM Distance: >3 FB Neck ROM: Full    Dental no notable dental hx. (+) Dental Advisory Given, Teeth Intact   Pulmonary asthma    Pulmonary exam normal breath sounds clear to auscultation       Cardiovascular hypertension, Normal cardiovascular exam Rhythm:Regular Rate:Normal     Neuro/Psych  Headaches PSYCHIATRIC DISORDERS  Depression       GI/Hepatic   Endo/Other    Renal/GU      Musculoskeletal   Abdominal  (+) + obese  Peds  Hematology  (+) Blood dyscrasia, anemia   Anesthesia Other Findings   Reproductive/Obstetrics negative OB ROS                             Anesthesia Physical Anesthesia Plan  ASA: 2  Anesthesia Plan: General   Post-op Pain Management: Tylenol PO (pre-op)* and Gabapentin PO (pre-op)*   Induction: Intravenous  PONV Risk Score and Plan: 4 or greater and Ondansetron, Dexamethasone, Treatment may vary due to age or medical condition and Midazolam  Airway Management Planned: LMA  Additional Equipment:   Intra-op Plan:   Post-operative Plan: Extubation in OR  Informed Consent: I have reviewed the patients History and Physical, chart, labs and discussed the procedure including the risks, benefits and alternatives for the proposed anesthesia with the patient or authorized representative who has indicated his/her understanding and acceptance.     Dental advisory given  Plan Discussed with: CRNA  Anesthesia Plan Comments:         Anesthesia Quick Evaluation

## 2022-12-13 NOTE — Brief Op Note (Signed)
12/13/2022  3:11 PM  PATIENT:  April Conner  27 y.o. female  PRE-OPERATIVE DIAGNOSIS:  vulvar cyst  POST-OPERATIVE DIAGNOSIS:  Left Bartholin's gland cyst  PROCEDURE:  Procedure(s): BARTHOLIN CYST MARSUPIALIZATION (Left)  SURGEON:  Surgeons and Role:    * [name], MD - Primary  ASSISTANTS: Eliberto Ivory, MD   ANESTHESIA:   local and general  EBL:  15 mL   BLOOD ADMINISTERED:none  DRAINS: none   LOCAL MEDICATIONS USED:  LIDOCAINE  and Amount: 10 ml  SPECIMEN:  No Specimen  DISPOSITION OF SPECIMEN:  N/A  COUNTS:  YES  TOURNIQUET:  * No tourniquets in log *  DICTATION: .Note written in EPIC  PLAN OF CARE: Discharge to home after PACU  PATIENT DISPOSITION:  PACU - hemodynamically stable.   Delay start of Pharmacological VTE agent (>24hrs) due to surgical blood loss or risk of bleeding: not applicable

## 2022-12-14 ENCOUNTER — Encounter (HOSPITAL_BASED_OUTPATIENT_CLINIC_OR_DEPARTMENT_OTHER): Payer: Self-pay | Admitting: Obstetrics and Gynecology

## 2022-12-15 NOTE — Anesthesia Postprocedure Evaluation (Signed)
Anesthesia Post Note  Patient: April Conner  Procedure(s) Performed: BARTHOLIN CYST MARSUPIALIZATION (Left: Vagina )     Patient location during evaluation: PACU Anesthesia Type: General Level of consciousness: sedated and patient cooperative Pain management: pain level controlled Vital Signs Assessment: post-procedure vital signs reviewed and stable Respiratory status: spontaneous breathing Cardiovascular status: stable Anesthetic complications: no   No notable events documented.  Last Vitals:  Vitals:   12/13/22 1545 12/13/22 1625  BP: 110/70 110/67  Pulse: 66 77  Resp: 11 12  Temp:  36.4 C  SpO2: 100% 99%    Last Pain:  Vitals:   12/14/22 1015  TempSrc:   PainSc: 0-No pain                 Lewie Loron

## 2023-02-08 DIAGNOSIS — J111 Influenza due to unidentified influenza virus with other respiratory manifestations: Secondary | ICD-10-CM | POA: Diagnosis not present

## 2024-01-23 ENCOUNTER — Ambulatory Visit (INDEPENDENT_AMBULATORY_CARE_PROVIDER_SITE_OTHER): Admitting: Family Medicine

## 2024-01-23 ENCOUNTER — Encounter: Payer: Self-pay | Admitting: Family Medicine

## 2024-01-23 VITALS — BP 134/87 | HR 84 | Resp 16 | Ht 61.0 in | Wt 174.0 lb

## 2024-01-23 DIAGNOSIS — F900 Attention-deficit hyperactivity disorder, predominantly inattentive type: Secondary | ICD-10-CM

## 2024-01-23 DIAGNOSIS — G4486 Cervicogenic headache: Secondary | ICD-10-CM

## 2024-01-23 DIAGNOSIS — R351 Nocturia: Secondary | ICD-10-CM

## 2024-01-23 DIAGNOSIS — F411 Generalized anxiety disorder: Secondary | ICD-10-CM | POA: Diagnosis not present

## 2024-01-23 DIAGNOSIS — R002 Palpitations: Secondary | ICD-10-CM | POA: Insufficient documentation

## 2024-01-23 DIAGNOSIS — Z7689 Persons encountering health services in other specified circumstances: Secondary | ICD-10-CM | POA: Diagnosis not present

## 2024-01-23 DIAGNOSIS — F332 Major depressive disorder, recurrent severe without psychotic features: Secondary | ICD-10-CM

## 2024-01-23 DIAGNOSIS — F4323 Adjustment disorder with mixed anxiety and depressed mood: Secondary | ICD-10-CM | POA: Diagnosis not present

## 2024-01-23 DIAGNOSIS — D509 Iron deficiency anemia, unspecified: Secondary | ICD-10-CM | POA: Insufficient documentation

## 2024-01-23 MED ORDER — ESCITALOPRAM OXALATE 5 MG PO TABS: 5.0000 mg | ORAL_TABLET | Freq: Every day | ORAL | 2 refills | Status: AC

## 2024-01-23 MED ORDER — AMPHETAMINE-DEXTROAMPHETAMINE 5 MG PO TABS: ORAL_TABLET | ORAL | 0 refills | Status: AC

## 2024-01-23 MED ORDER — PROPRANOLOL HCL 20 MG PO TABS: 20.0000 mg | ORAL_TABLET | Freq: Every evening | ORAL | 2 refills | Status: AC
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
## 2024-01-23 NOTE — Patient Instructions (Signed)

## 2024-02-22 ENCOUNTER — Ambulatory Visit: Admitting: Family Medicine
